# Patient Record
Sex: Male | Born: 1995 | Hispanic: Yes | Marital: Married | Smoking: Never smoker
Health system: Southern US, Community
[De-identification: ages and names within clinical notes are randomized; demographics above are authoritative.]

## PROBLEM LIST (undated history)

## (undated) DIAGNOSIS — Z111 Encounter for screening for respiratory tuberculosis: Secondary | ICD-10-CM

## (undated) DIAGNOSIS — J45909 Unspecified asthma, uncomplicated: Secondary | ICD-10-CM

## (undated) DIAGNOSIS — S62509A Fracture of unspecified phalanx of unspecified thumb, initial encounter for closed fracture: Secondary | ICD-10-CM

## (undated) DIAGNOSIS — S89319D Salter-Harris Type I physeal fracture of lower end of unspecified fibula, subsequent encounter for fracture with routine healing: Secondary | ICD-10-CM

## (undated) HISTORY — DX: Unspecified asthma, uncomplicated: J45.909

## (undated) HISTORY — DX: Salter-harris type i physeal fracture of lower end of unspecified fibula, subsequent encounter for fracture with routine healing: S89.319D

## (undated) HISTORY — DX: Encounter for screening for respiratory tuberculosis: Z11.1

## (undated) HISTORY — DX: Fracture of unspecified phalanx of unspecified thumb, initial encounter for closed fracture: S62.509A

---
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
## 2020-12-23 ENCOUNTER — Ambulatory Visit
Admission: EM | Admit: 2020-12-23 | Discharge: 2020-12-23 | Disposition: A | Discharge status: Home / Self Care | Payer: BC Managed Care – PPO | Attending: Urgent Care | Admitting: Urgent Care

## 2020-12-23 ENCOUNTER — Other Ambulatory Visit: Payer: Self-pay

## 2020-12-23 ENCOUNTER — Encounter: Payer: Self-pay | Admitting: Emergency Medicine

## 2020-12-23 DIAGNOSIS — M67431 Ganglion, right wrist: Secondary | ICD-10-CM | POA: Diagnosis not present

## 2020-12-23 MED ORDER — NAPROXEN 375 MG PO TABS: 375.0000 mg | ORAL_TABLET | Freq: Two times a day (BID) | ORAL | 0 refills | Status: DC

## 2020-12-23 NOTE — ED Triage Notes (Signed)
Pt here for right wrist pain and swollen area that has been present x several weeks

## 2020-12-23 NOTE — ED Provider Notes (Signed)
  [facility]   MRN: [id number] DOB: Oct 21, 1995  Subjective:   Christian Park is a 25 y.o. male presenting for several week history of persistent right breast mass that is becoming more more uncomfortable.  Patient states that there is a swollen spot that hurts with pressure or flexing at the wrist.  Denies fever, redness, warmth, drainage of pus or bleeding.   Current Facility-Administered Medications:    ibuprofen (ADVIL,MOTRIN) tablet 600 mg, 600 mg, Oral, Once, Kavanaugh, Lewayne Bunting, MD  Current Outpatient Medications:    albuterol (PROVENTIL HFA;VENTOLIN HFA) 108 (90 BASE) MCG/ACT inhaler, Inhale 2 puffs into the lungs every 4 (four) hours as needed for wheezing or shortness of breath., Disp: 1 Inhaler, Rfl: 2   Allergies  Allergen Reactions   Pomegranate [Punica] Rash    Past Medical History:  Diagnosis Date   Asthma    Salter-Harris Type I fracture of distal fibula with routine healing Feb 2010   left leg   Thumb fracture May 2010   right thumb   Tuberculosis screening Jan 2008   negative PPD     History reviewed. No pertinent surgical history.  History reviewed. No pertinent family history.  Social History   Tobacco Use   Smoking status: Never  Substance Use Topics   Alcohol use: No    ROS   Objective:   Vitals: BP 126/78 (BP Location: Left Arm)   Pulse 95   Temp 98.9 F (37.2 C) (Oral)   Resp 20   SpO2 97%   Physical Exam Constitutional:      General: He is not in acute distress.    Appearance: Normal appearance. He is well-developed and normal weight. He is not ill-appearing, toxic-appearing or diaphoretic.  HENT:     Head: Normocephalic and atraumatic.     Right Ear: External ear normal.     Left Ear: External ear normal.     Nose: Nose normal.     Mouth/Throat:     Pharynx: Oropharynx is clear.  Eyes:     General: No scleral icterus.       Right eye: No discharge.        Left eye: No discharge.     Extraocular Movements:  Extraocular movements intact.     Pupils: Pupils are equal, round, and reactive to light.  Cardiovascular:     Rate and Rhythm: Normal rate.  Pulmonary:     Effort: Pulmonary effort is normal.  Musculoskeletal:       Arms:     Cervical back: Normal range of motion.  Neurological:     Mental Status: He is alert and oriented to person, place, and time.  Psychiatric:        Mood and Affect: Mood normal.        Behavior: Behavior normal.        Thought Content: Thought content normal.        Judgment: Judgment normal.    ==  Assessment and Plan :   PDMP not reviewed this encounter.  1. Ganglion cyst of volar aspect of right wrist    High suspicion for ganglion cyst.  Recommended conservative management, naproxen for pain and inflammation, follow-up with hand specialist for procedural removal. Counseled patient on potential for adverse effects with medications prescribed/recommended today, ER and return-to-clinic precautions discussed, patient verbalized understanding.    Wallis Bamberg, PA-C 12/23/20 1225

## 2020-12-27 ENCOUNTER — Encounter: Payer: Self-pay | Admitting: Emergency Medicine

## 2020-12-27 ENCOUNTER — Other Ambulatory Visit: Payer: Self-pay

## 2020-12-27 ENCOUNTER — Ambulatory Visit
Admission: EM | Admit: 2020-12-27 | Discharge: 2020-12-27 | Disposition: A | Discharge status: Home / Self Care | Payer: BC Managed Care – PPO | Attending: Urgent Care | Admitting: Urgent Care

## 2020-12-27 DIAGNOSIS — J069 Acute upper respiratory infection, unspecified: Secondary | ICD-10-CM | POA: Diagnosis not present

## 2020-12-27 DIAGNOSIS — L299 Pruritus, unspecified: Secondary | ICD-10-CM

## 2020-12-27 DIAGNOSIS — Z20822 Contact with and (suspected) exposure to covid-19: Secondary | ICD-10-CM | POA: Diagnosis not present

## 2020-12-27 DIAGNOSIS — R21 Rash and other nonspecific skin eruption: Secondary | ICD-10-CM

## 2020-12-27 MED ORDER — CETIRIZINE HCL 10 MG PO TABS: 10.0000 mg | ORAL_TABLET | Freq: Every day | ORAL | 0 refills | Status: DC

## 2020-12-27 MED ORDER — BENZONATATE 100 MG PO CAPS: 100.0000 mg | ORAL_CAPSULE | Freq: Three times a day (TID) | ORAL | 0 refills | Status: DC | PRN

## 2020-12-27 MED ORDER — PROMETHAZINE-DM 6.25-15 MG/5ML PO SYRP: 5.0000 mL | ORAL_SOLUTION | Freq: Every evening | ORAL | 0 refills | Status: DC | PRN

## 2020-12-27 MED ORDER — HYDROXYZINE HCL 25 MG PO TABS: 12.5000 mg | ORAL_TABLET | Freq: Three times a day (TID) | ORAL | 0 refills | Status: DC | PRN

## 2020-12-27 MED ORDER — PSEUDOEPHEDRINE HCL 60 MG PO TABS: 60.0000 mg | ORAL_TABLET | Freq: Three times a day (TID) | ORAL | 0 refills | Status: DC | PRN

## 2020-12-27 NOTE — ED Triage Notes (Signed)
Patient c/o non-productive cough x 5 days, nasal congestion.  Patient has a rash that started on his back and now has spreaded all over.  Patient is not vaccinated for COVID.

## 2020-12-27 NOTE — ED Provider Notes (Signed)
[facility]   MRN: [id number] DOB: 07-May-1996  Subjective:   Christian Park is a 25 y.o. male presenting for 5-day history of acute onset sinus congestion, postnasal drainage, coughing.  Coughing can elicit head pain.  In the past couple of days he has developed a very itchy rash over his back.  Denies fever, sore throat, body aches, chest pain, shortness of breath, wheezing.  Has a history of asthma but has not needed to use an albuterol inhaler for a while. No COVID vaccination.    Current Facility-Administered Medications:    ibuprofen (ADVIL,MOTRIN) tablet 600 mg, 600 mg, Oral, Once, Kavanaugh, Lewayne Bunting, MD  Current Outpatient Medications:    albuterol (PROVENTIL HFA;VENTOLIN HFA) 108 (90 BASE) MCG/ACT inhaler, Inhale 2 puffs into the lungs every 4 (four) hours as needed for wheezing or shortness of breath., Disp: 1 Inhaler, Rfl: 2   naproxen (NAPROSYN) 375 MG tablet, Take 1 tablet (375 mg total) by mouth 2 (two) times daily with a meal., Disp: 30 tablet, Rfl: 0   Allergies  Allergen Reactions   Pomegranate [Punica] Rash    Past Medical History:  Diagnosis Date   Asthma    Salter-Harris Type I fracture of distal fibula with routine healing Feb 2010   left leg   Thumb fracture May 2010   right thumb   Tuberculosis screening Jan 2008   negative PPD     History reviewed. No pertinent surgical history.  No family history on file.  Social History   Tobacco Use   Smoking status: Never  Substance Use Topics   Alcohol use: No    ROS   Objective:   Vitals: BP 127/67 (BP Location: Left Arm)   Pulse 76   Temp 98.2 F (36.8 C) (Oral)   Ht 5\' 11"  (1.803 m)   Wt 215 lb (97.5 kg)   SpO2 98%   BMI 29.99 kg/m   Physical Exam Constitutional:      General: He is not in acute distress.    Appearance: Normal appearance. He is well-developed and normal weight. He is not ill-appearing, toxic-appearing or diaphoretic.  HENT:     Head: Normocephalic and  atraumatic.     Right Ear: External ear normal.     Left Ear: External ear normal.     Nose: Nose normal.     Mouth/Throat:     Mouth: Mucous membranes are moist.     Pharynx: No pharyngeal swelling, oropharyngeal exudate, posterior oropharyngeal erythema or uvula swelling.     Tonsils: No tonsillar exudate or tonsillar abscesses. 0 on the right. 0 on the left.  Eyes:     General: No scleral icterus.       Right eye: No discharge.        Left eye: No discharge.     Extraocular Movements: Extraocular movements intact.     Pupils: Pupils are equal, round, and reactive to light.  Cardiovascular:     Rate and Rhythm: Normal rate and regular rhythm.     Heart sounds: Normal heart sounds. No murmur heard.   No friction rub. No gallop.  Pulmonary:     Effort: Pulmonary effort is normal. No respiratory distress.     Breath sounds: Normal breath sounds. No stridor. No wheezing, rhonchi or rales.  Musculoskeletal:     Cervical back: Normal range of motion.  Skin:    General: Skin is warm and dry.     Findings: Rash (papular sand paper like rash over his  back) present.  Neurological:     Mental Status: He is alert and oriented to person, place, and time.  Psychiatric:        Mood and Affect: Mood normal.        Behavior: Behavior normal.        Thought Content: Thought content normal.        Judgment: Judgment normal.    Assessment and Plan :   PDMP not reviewed this encounter.  1. Viral URI with cough   2. Rash and nonspecific skin eruption   3. Itching     Will manage for viral illness such as viral URI, viral syndrome, viral rhinitis, COVID-19, viral exanthem.  Given physical exam findings, I have low suspicion for strep throat, monkeypox, pityriasis.  Counseled patient on nature of COVID-19 including modes of transmission, diagnostic testing, management and supportive care.  Offered scripts for symptomatic relief. COVID 19 testing is pending. Counseled patient on potential for  adverse effects with medications prescribed/recommended today, ER and return-to-clinic precautions discussed, patient verbalized understanding.     Wallis Bamberg, PA-C 12/27/20 1512

## 2020-12-27 NOTE — Discharge Instructions (Addendum)
We will notify you of your COVID-19 test results as they arrive and may take between 24 to 48 hours.  I encourage you to sign up for MyChart if you have not already done so as this can be the easiest way for [place] to communicate results to you online or through a phone app.  In the meantime, if you develop worsening symptoms including fever, chest pain, shortness of breath despite our current treatment plan then please report to the emergency room as this may be a sign of worsening status from possible COVID-19 infection.  Otherwise, we will manage this as a viral syndrome. For sore throat or cough try using a honey-based tea. Use 3 teaspoons of honey with juice squeezed from half lemon. Place shaved pieces of ginger into 1/2-1 cup of water and warm over stove top. Then mix the ingredients and repeat every 4 hours as needed. Please take Tylenol 500mg -650mg  every 6 hours for aches and pains, fevers. Hydrate very well with at least 2 liters of water. Eat light meals such as soups to replenish electrolytes and soft fruits, veggies. Start an antihistamine like Zyrtec, Allegra or Claritin for postnasal drainage, sinus congestion.  You can take this together with pseudoephedrine (Sudafed) at a dose of 60 mg 2-3 times a day as needed for the same kind of congestion.  Use hydroxyzine for the itchy rash. You will have cough syrup and cough capsules as well that you could use cough suppression.

## 2020-12-28 LAB — NOVEL CORONAVIRUS, NAA: SARS-CoV-2, NAA: NOT DETECTED

## 2020-12-28 LAB — SARS-COV-2, NAA 2 DAY TAT

## 2021-02-03 ENCOUNTER — Emergency Department (HOSPITAL_COMMUNITY)
Admission: EM | Admit: 2021-02-03 | Discharge: 2021-02-04 | Disposition: A | Discharge status: Home / Self Care | Payer: BC Managed Care – PPO | Attending: Emergency Medicine | Admitting: Emergency Medicine

## 2021-02-03 ENCOUNTER — Ambulatory Visit (INDEPENDENT_AMBULATORY_CARE_PROVIDER_SITE_OTHER): Payer: BC Managed Care – PPO

## 2021-02-03 ENCOUNTER — Other Ambulatory Visit: Payer: Self-pay

## 2021-02-03 ENCOUNTER — Encounter: Payer: Self-pay | Admitting: Urgent Care

## 2021-02-03 ENCOUNTER — Ambulatory Visit
Admission: EM | Admit: 2021-02-03 | Discharge: 2021-02-03 | Disposition: A | Discharge status: Home / Self Care | Payer: BC Managed Care – PPO | Attending: Urgent Care | Admitting: Urgent Care

## 2021-02-03 ENCOUNTER — Encounter (HOSPITAL_COMMUNITY): Payer: Self-pay | Admitting: Emergency Medicine

## 2021-02-03 ENCOUNTER — Emergency Department (HOSPITAL_COMMUNITY): Payer: BC Managed Care – PPO

## 2021-02-03 DIAGNOSIS — R059 Cough, unspecified: Secondary | ICD-10-CM | POA: Diagnosis not present

## 2021-02-03 DIAGNOSIS — J189 Pneumonia, unspecified organism: Secondary | ICD-10-CM | POA: Diagnosis not present

## 2021-02-03 DIAGNOSIS — R079 Chest pain, unspecified: Secondary | ICD-10-CM | POA: Diagnosis not present

## 2021-02-03 DIAGNOSIS — R202 Paresthesia of skin: Secondary | ICD-10-CM | POA: Insufficient documentation

## 2021-02-03 DIAGNOSIS — R0781 Pleurodynia: Secondary | ICD-10-CM | POA: Insufficient documentation

## 2021-02-03 DIAGNOSIS — R911 Solitary pulmonary nodule: Secondary | ICD-10-CM | POA: Diagnosis not present

## 2021-02-03 DIAGNOSIS — J45909 Unspecified asthma, uncomplicated: Secondary | ICD-10-CM | POA: Diagnosis not present

## 2021-02-03 DIAGNOSIS — J181 Lobar pneumonia, unspecified organism: Secondary | ICD-10-CM | POA: Diagnosis not present

## 2021-02-03 DIAGNOSIS — R918 Other nonspecific abnormal finding of lung field: Secondary | ICD-10-CM

## 2021-02-03 DIAGNOSIS — R0602 Shortness of breath: Secondary | ICD-10-CM | POA: Diagnosis not present

## 2021-02-03 LAB — BASIC METABOLIC PANEL
Anion gap: 12 (ref 5–15)
BUN: 11 mg/dL (ref 6–20)
CO2: 27 mmol/L (ref 22–32)
Calcium: 9 mg/dL (ref 8.9–10.3)
Chloride: 99 mmol/L (ref 98–111)
Creatinine, Ser: 1.18 mg/dL (ref 0.61–1.24)
GFR, Estimated: >60 mL/min (ref >60)
Glucose, Bld: 85 mg/dL (ref 70–99)
Potassium: 4.4 mmol/L (ref 3.5–5.1)
Sodium: 138 mmol/L (ref 135–145)

## 2021-02-03 LAB — CBC
HCT: 45.8 % (ref 39.0–52.0)
Hemoglobin: 15.3 g/dL (ref 13.0–17.0)
MCH: 29.9 pg (ref 26.0–34.0)
MCHC: 33.4 g/dL (ref 30.0–36.0)
MCV: 89.5 fL (ref 80.0–100.0)
Platelets: 321 10*3/uL (ref 150–400)
RBC: 5.12 MIL/uL (ref 4.22–5.81)
RDW: 12.4 % (ref 11.5–15.5)
WBC: 10.6 10*3/uL — ABNORMAL HIGH (ref 4.0–10.5)
nRBC: 0 % (ref 0.0–0.2)

## 2021-02-03 LAB — TROPONIN I (HIGH SENSITIVITY): Troponin I (High Sensitivity): <2 ng/L (ref <18)

## 2021-02-03 MED ORDER — AMOXICILLIN 500 MG PO CAPS: 1000.0000 mg | ORAL_CAPSULE | Freq: Three times a day (TID) | ORAL | 0 refills | Status: DC

## 2021-02-03 MED ORDER — AZITHROMYCIN 250 MG PO TABS: ORAL_TABLET | ORAL | 0 refills | Status: DC

## 2021-02-03 MED ORDER — BENZONATATE 100 MG PO CAPS: 100.0000 mg | ORAL_CAPSULE | Freq: Three times a day (TID) | ORAL | 0 refills | Status: DC | PRN

## 2021-02-03 MED ORDER — PROMETHAZINE-DM 6.25-15 MG/5ML PO SYRP: 5.0000 mL | ORAL_SOLUTION | Freq: Every evening | ORAL | 0 refills | Status: DC | PRN

## 2021-02-03 NOTE — ED Triage Notes (Signed)
Patient here with chest pain that started yesterday. Patient denies any nausea or vomiting with the chest pain.  No cough.  He is having pain with the deep breath.

## 2021-02-03 NOTE — ED Provider Notes (Signed)
[facility]   MRN: [id number] DOB: 05-28-1995  Subjective:   Christian Park is a 25 y.o. male presenting for 2 day history of acute onset mid-left sided chest pain with radiation to the left neck, tingling in the left arm. Symptoms actually started in his neck but then quickly migrated to the left side of his chest.  Was not doing anything in particular that was outside his normal routine.  Father has a history of MI, has a pacemaker. Believes this was a diagnosed established in his 47's. Has history of asthma, has not needed an inhaler since he was young. Denies fever, cough, sinus congestion, shob, n/v, abdominal pain, diaphoresis. No drug use, history of smoking, alcohol use.    Current Facility-Administered Medications:    ibuprofen (ADVIL,MOTRIN) tablet 600 mg, 600 mg, Oral, Once, Kavanaugh, Lewayne Bunting, MD  Current Outpatient Medications:    albuterol (PROVENTIL HFA;VENTOLIN HFA) 108 (90 BASE) MCG/ACT inhaler, Inhale 2 puffs into the lungs every 4 (four) hours as needed for wheezing or shortness of breath., Disp: 1 Inhaler, Rfl: 2   benzonatate (TESSALON) 100 MG capsule, Take 1-2 capsules (100-200 mg total) by mouth 3 (three) times daily as needed., Disp: 60 capsule, Rfl: 0   cetirizine (ZYRTEC ALLERGY) 10 MG tablet, Take 1 tablet (10 mg total) by mouth daily., Disp: 30 tablet, Rfl: 0   hydrOXYzine (ATARAX/VISTARIL) 25 MG tablet, Take 0.5-1 tablets (12.5-25 mg total) by mouth every 8 (eight) hours as needed for itching., Disp: 30 tablet, Rfl: 0   naproxen (NAPROSYN) 375 MG tablet, Take 1 tablet (375 mg total) by mouth 2 (two) times daily with a meal., Disp: 30 tablet, Rfl: 0   promethazine-dextromethorphan (PROMETHAZINE-DM) 6.25-15 MG/5ML syrup, Take 5 mLs by mouth at bedtime as needed for cough., Disp: 100 mL, Rfl: 0   pseudoephedrine (SUDAFED) 60 MG tablet, Take 1 tablet (60 mg total) by mouth every 8 (eight) hours as needed for congestion., Disp: 30 tablet, Rfl: 0    Allergies  Allergen Reactions   Pomegranate [Punica] Rash    Past Medical History:  Diagnosis Date   Asthma    Salter-Harris Type I fracture of distal fibula with routine healing Feb 2010   left leg   Thumb fracture May 2010   right thumb   Tuberculosis screening Jan 2008   negative PPD     No past surgical history on file.  No family history on file.  Social History   Tobacco Use   Smoking status: Never  Substance Use Topics   Alcohol use: No    ROS   Objective:   Vitals: BP 126/76 (BP Location: Left Arm)   Pulse 77   Temp 98.7 F (37.1 C) (Oral)   Resp 20   SpO2 97%   Physical Exam Constitutional:      General: He is not in acute distress.    Appearance: Normal appearance. He is well-developed. He is not ill-appearing, toxic-appearing or diaphoretic.  HENT:     Head: Normocephalic and atraumatic.     Right Ear: External ear normal.     Left Ear: External ear normal.     Nose: Nose normal.     Mouth/Throat:     Mouth: Mucous membranes are moist.     Pharynx: Oropharynx is clear.  Eyes:     General: No scleral icterus.    Extraocular Movements: Extraocular movements intact.     Pupils: Pupils are equal, round, and reactive to light.  Cardiovascular:  Rate and Rhythm: Normal rate and regular rhythm.     Heart sounds: Normal heart sounds. No murmur heard.   No friction rub. No gallop.  Pulmonary:     Effort: Pulmonary effort is normal. No respiratory distress.     Breath sounds: Normal breath sounds. No stridor. No wheezing, rhonchi or rales.  Neurological:     Mental Status: He is alert and oriented to person, place, and time.  Psychiatric:        Mood and Affect: Mood normal.        Behavior: Behavior normal.        Thought Content: Thought content normal.    ED ECG REPORT   Date: 02/03/2021  EKG Time: 10:00 AM  Rate: 83bpm  Rhythm: normal sinus rhythm,  normal EKG, normal sinus rhythm, nonspecific T wave inversion in lead III, T wave  flattening in aVF  Axis: Right  Intervals:none  ST&T Change: As above  Narrative Interpretation: Sinus rhythm at 83 bpm with nonspecific T wave changes as above and a rightward axis.  No previous EKG available for comparison.  DG Chest 2 View  Result Date: 02/03/2021 CLINICAL DATA:  25 year old male with chest pain for 2 days. No known injury. Cough. EXAM: CHEST - 2 VIEW COMPARISON:  None. FINDINGS: Normal lung volumes and mediastinal contours. Visualized tracheal air column is within normal limits. No consolidation or pleural effusion. On the AP view there is asymmetric density at the junction of the anterior left 5th and posterior 8th ribs, with no definite abnormal opacity on the lateral. Elsewhere lung markings appear normal. No osseous abnormality identified. Negative visible bowel gas pattern. IMPRESSION: Questionable asymmetric increased left lower lung opacity, only on the AP view. Consider mild or developing left bronchopneumonia in this setting. Follow-up radiographs may be valuable. Electronically Signed   By: Odessa Fleming M.D.   On: 02/03/2021 10:44    Assessment and Plan :   PDMP not reviewed this encounter.  1. Community acquired pneumonia of left lung, unspecified part of lung   2. Left-sided chest pain     Patient refused COVID-19 test.  Recommended amoxicillin and azithromycin for left sided community-acquired pneumonia.  Use supportive care. Counseled patient on potential for adverse effects with medications prescribed/recommended today, ER and return-to-clinic precautions discussed, patient verbalized understanding.    Wallis Bamberg, PA-C 02/03/21 1100

## 2021-02-04 ENCOUNTER — Telehealth: Payer: Self-pay | Admitting: Pulmonary Disease

## 2021-02-04 ENCOUNTER — Emergency Department (HOSPITAL_COMMUNITY): Payer: BC Managed Care – PPO

## 2021-02-04 DIAGNOSIS — J189 Pneumonia, unspecified organism: Secondary | ICD-10-CM | POA: Diagnosis not present

## 2021-02-04 DIAGNOSIS — R911 Solitary pulmonary nodule: Secondary | ICD-10-CM | POA: Diagnosis not present

## 2021-02-04 DIAGNOSIS — R079 Chest pain, unspecified: Secondary | ICD-10-CM | POA: Diagnosis not present

## 2021-02-04 DIAGNOSIS — R0602 Shortness of breath: Secondary | ICD-10-CM | POA: Diagnosis not present

## 2021-02-04 DIAGNOSIS — R0781 Pleurodynia: Secondary | ICD-10-CM | POA: Diagnosis not present

## 2021-02-04 LAB — TROPONIN I (HIGH SENSITIVITY): Troponin I (High Sensitivity): <2 ng/L (ref <18)

## 2021-02-04 MED ORDER — MORPHINE SULFATE (PF) 4 MG/ML IV SOLN
4.0000 mg | Freq: Once | INTRAVENOUS | Status: AC
Start: 2021-02-04 — End: 2021-02-04
  Administered 2021-02-04: 4 mg via INTRAVENOUS
  Filled 2021-02-04: qty 1

## 2021-02-04 MED ORDER — AMOXICILLIN-POT CLAVULANATE 875-125 MG PO TABS
1.0000 | ORAL_TABLET | Freq: Once | ORAL | Status: AC
  Administered 2021-02-04: 1 via ORAL
  Filled 2021-02-04: qty 1

## 2021-02-04 MED ORDER — IBUPROFEN 800 MG PO TABS
800.0000 mg | ORAL_TABLET | Freq: Once | ORAL | Status: AC
  Administered 2021-02-04: 800 mg via ORAL
  Filled 2021-02-04: qty 2

## 2021-02-04 MED ORDER — OXYCODONE-ACETAMINOPHEN 5-325 MG PO TABS: 1.0000 | ORAL_TABLET | Freq: Four times a day (QID) | ORAL | 0 refills | Status: DC | PRN

## 2021-02-04 MED ORDER — AMOXICILLIN-POT CLAVULANATE 875-125 MG PO TABS: 1.0000 | ORAL_TABLET | Freq: Two times a day (BID) | ORAL | 0 refills | Status: DC

## 2021-02-04 MED ORDER — OXYCODONE-ACETAMINOPHEN 5-325 MG PO TABS
1.0000 | ORAL_TABLET | Freq: Once | ORAL | Status: AC
  Administered 2021-02-04: 1 via ORAL
  Filled 2021-02-04: qty 1

## 2021-02-04 MED ORDER — IOHEXOL 350 MG/ML SOLN
40.0000 mL | Freq: Once | INTRAVENOUS | Status: AC | PRN
  Administered 2021-02-04: 40 mL via INTRAVENOUS

## 2021-02-04 NOTE — ED Notes (Signed)
PT has been called x3 no answer

## 2021-02-04 NOTE — ED Notes (Signed)
PT called X3 no answer.

## 2021-02-04 NOTE — Telephone Encounter (Signed)
Urgent care requesting office follow-up appointment -Please make appointment with next available provider for new consult in 1 week  -Admitted for chest pain, found to have multiple pulmonary nodules, nontoxic-appearing, no fever or leukocytosis -No risk factors for TB  Advised blood cultures, empiric treatment with antibiotics, differential includes metastatic malignancy, testicular exam

## 2021-02-04 NOTE — ED Provider Notes (Signed)
[facility] EMERGENCY DEPARTMENT Provider Note   CSN: [id number] Arrival date & time: 02/03/21  1952     History Chief Complaint  Patient presents with   Chest Pain    Delray Reza is a 25 y.o. male.  He is here with a complaint of left-sided chest pain that started 2 days ago.  He said the symptoms actually started in his neck and it was associated with some tingling on his left shoulder.  Pain is worse with cough and deep breath.  No history of same.  Does have family history of father with cardiac disease that started in his 71s.  No fevers chills vomiting abdominal pain.  Denies smoking or drug use.  No trauma.  Went to urgent care yesterday and diagnosed with left-sided pneumonia, given prescriptions for amoxicillin and Zithromax.  Has not started yet  The history is provided by the patient.  Chest Pain Pain location:  L chest Pain quality: sharp and stabbing   Pain severity:  Severe Onset quality:  Gradual Duration:  2 days Timing:  Constant Progression:  Worsening Chronicity:  New Relieved by:  None tried Worsened by:  Coughing and deep breathing Ineffective treatments:  None tried Associated symptoms: cough and shortness of breath   Associated symptoms: no abdominal pain, no dizziness, no fever, no headache, no nausea and no vomiting   Risk factors: male sex   Risk factors: no prior DVT/PE and no smoking       Past Medical History:  Diagnosis Date   Asthma    Salter-Harris Type I fracture of distal fibula with routine healing Feb 2010   left leg   Thumb fracture May 2010   right thumb   Tuberculosis screening Jan 2008   negative PPD    Patient Active Problem List   Diagnosis Date Noted   Family history of cardiovascular disease 04/05/2014   Body mass index, pediatric, 5th percentile to less than 85th percentile for age 51/17/2015   Wears glasses 02/08/2014    History reviewed. No pertinent surgical history.     Family History   Problem Relation Age of Onset   Heart disease Father     Social History   Tobacco Use   Smoking status: Never   Smokeless tobacco: Never  Substance Use Topics   Alcohol use: No   Drug use: Not Currently    Home Medications Prior to Admission medications   Medication Sig Start Date End Date Taking? Authorizing Provider  albuterol (PROVENTIL HFA;VENTOLIN HFA) 108 (90 BASE) MCG/ACT inhaler Inhale 2 puffs into the lungs every 4 (four) hours as needed for wheezing or shortness of breath. 05/01/14   Angelina Pih, MD  amoxicillin (AMOXIL) 500 MG capsule Take 2 capsules (1,000 mg total) by mouth 3 (three) times daily. 02/03/21   Wallis Bamberg, PA-C  azithromycin (ZITHROMAX) 250 MG tablet Start with 2 tablets today, then 1 daily thereafter. 02/03/21   Wallis Bamberg, PA-C  benzonatate (TESSALON) 100 MG capsule Take 1-2 capsules (100-200 mg total) by mouth 3 (three) times daily as needed. 02/03/21   Wallis Bamberg, PA-C  cetirizine (ZYRTEC ALLERGY) 10 MG tablet Take 1 tablet (10 mg total) by mouth daily. 12/27/20   Wallis Bamberg, PA-C  hydrOXYzine (ATARAX/VISTARIL) 25 MG tablet Take 0.5-1 tablets (12.5-25 mg total) by mouth every 8 (eight) hours as needed for itching. 12/27/20   Wallis Bamberg, PA-C  naproxen (NAPROSYN) 375 MG tablet Take 1 tablet (375 mg total) by mouth 2 (two) times  daily with a meal. 12/23/20   Wallis Bamberg, PA-C  promethazine-dextromethorphan (PROMETHAZINE-DM) 6.25-15 MG/5ML syrup Take 5 mLs by mouth at bedtime as needed for cough. 02/03/21   Wallis Bamberg, PA-C  pseudoephedrine (SUDAFED) 60 MG tablet Take 1 tablet (60 mg total) by mouth every 8 (eight) hours as needed for congestion. 12/27/20   Wallis Bamberg, PA-C    Allergies    Pomegranate [punica]  Review of Systems   Review of Systems  Constitutional:  Negative for fever.  HENT:  Negative for sore throat.   Eyes:  Negative for visual disturbance.  Respiratory:  Positive for cough and shortness of breath.   Cardiovascular:  Positive for  chest pain.  Gastrointestinal:  Negative for abdominal pain, nausea and vomiting.  Genitourinary:  Negative for dysuria.  Musculoskeletal:  Positive for neck pain.  Skin:  Negative for rash.  Neurological:  Negative for dizziness and headaches.   Physical Exam Updated Vital Signs BP 116/69 (BP Location: Left Arm)   Pulse 75   Temp 98.7 F (37.1 C)   Resp 17   SpO2 96%   Physical Exam Vitals and nursing note reviewed.  Constitutional:      Appearance: He is well-developed.  HENT:     Head: Normocephalic and atraumatic.  Eyes:     Conjunctiva/sclera: Conjunctivae normal.  Cardiovascular:     Rate and Rhythm: Normal rate and regular rhythm.     Heart sounds: Normal heart sounds. No murmur heard. Pulmonary:     Effort: Pulmonary effort is normal. No respiratory distress.     Breath sounds: Normal breath sounds.  Chest:     Chest wall: Tenderness present.     Comments: He has point tenderness of his left mid axillary line just below the breast.  Palpation of this reproduces patient's pain. Abdominal:     Palpations: Abdomen is soft.     Tenderness: There is no abdominal tenderness.  Genitourinary:    Comments: Testicular exam no masses appreciated Musculoskeletal:        General: Normal range of motion.     Cervical back: Neck supple.     Right lower leg: No tenderness.     Left lower leg: No tenderness.  Skin:    General: Skin is warm and dry.  Neurological:     General: No focal deficit present.     Mental Status: He is alert.     Cranial Nerves: No cranial nerve deficit.     Motor: No weakness.    ED Results / Procedures / Treatments   Labs (all labs ordered are listed, but only abnormal results are displayed) Labs Reviewed  CBC - Abnormal; Notable for the following components:      Result Value   WBC 10.6 (*)    All other components within normal limits  CULTURE, BLOOD (ROUTINE X 2)  CULTURE, BLOOD (ROUTINE X 2)  BASIC METABOLIC PANEL  TROPONIN I (HIGH  SENSITIVITY)  TROPONIN I (HIGH SENSITIVITY)    EKG EKG Interpretation  Date/Time:  Monday February 03 2021 21:00:22 EDT Ventricular Rate:  101 PR Interval:  126 QRS Duration: 86 QT Interval:  314 QTC Calculation: 407 R Axis:   88 Text Interpretation: Sinus tachycardia Abnormal QRS-T angle, consider primary T wave abnormality Abnormal ECG No significant change since last tracing Confirmed by Meridee Score [phone]) on 02/04/2021 8:18:09 AM  Radiology DG Chest 2 View  Result Date: 02/03/2021 CLINICAL DATA:  Chest pain. EXAM: CHEST - 2 VIEW COMPARISON:  Same  day. FINDINGS: The heart size and mediastinal contours are within normal limits. Right lung is clear. Continued presence of left lower lobe opacity is noted concerning for possible pneumonia or atelectasis. The visualized skeletal structures are unremarkable. IMPRESSION: Small left lower lobe airspace opacity is noted concerning for possible pneumonia or atelectasis. Electronically Signed   By: Lupita Raider M.D.   On: 02/03/2021 21:22   DG Chest 2 View  Result Date: 02/03/2021 CLINICAL DATA:  25 year old male with chest pain for 2 days. No known injury. Cough. EXAM: CHEST - 2 VIEW COMPARISON:  None. FINDINGS: Normal lung volumes and mediastinal contours. Visualized tracheal air column is within normal limits. No consolidation or pleural effusion. On the AP view there is asymmetric density at the junction of the anterior left 5th and posterior 8th ribs, with no definite abnormal opacity on the lateral. Elsewhere lung markings appear normal. No osseous abnormality identified. Negative visible bowel gas pattern. IMPRESSION: Questionable asymmetric increased left lower lung opacity, only on the AP view. Consider mild or developing left bronchopneumonia in this setting. Follow-up radiographs may be valuable. Electronically Signed   By: Odessa Fleming M.D.   On: 02/03/2021 10:44   CT Angio Chest PE W/Cm &/Or Wo Cm  Result Date: 02/04/2021 CLINICAL  DATA:  Shortness of breath, chest pain EXAM: CT ANGIOGRAPHY CHEST WITH CONTRAST TECHNIQUE: Multidetector CT imaging of the chest was performed using the standard protocol during bolus administration of intravenous contrast. Multiplanar CT image reconstructions and MIPs were obtained to evaluate the vascular anatomy. CONTRAST:  42mL OMNIPAQUE IOHEXOL 350 MG/ML SOLN COMPARISON:  None. FINDINGS: Cardiovascular: No filling defects in the pulmonary arteries to suggest pulmonary emboli. Heart is normal size. Aorta is normal caliber. Mediastinum/Nodes: No mediastinal, hilar, or axillary adenopathy. Soft tissue in anterior mediastinum thought to reflect residual thymus. Trachea and esophagus are unremarkable. Thyroid unremarkable. Lungs/Pleura: Consolidation in the lingula and left lower lobe concerning for pneumonia. Scattered pulmonary nodules, the largest in the left lower lobe with central cavitation measures 2.4 cm. Lingular nodule measures 9 mm. Right upper lobe nodule measures 7 mm. Several other similarly sized nodules throughout both lungs. No effusions. Upper Abdomen: Imaging into the upper abdomen demonstrates no acute findings. Musculoskeletal: Chest wall soft tissues are unremarkable. No acute bony abnormality. Review of the MIP images confirms the above findings. IMPRESSION: No evidence of pulmonary embolus. Numerous bilateral pulmonary nodules, the largest 2.4 cm with central cavitation in the left lower lobe. Differential considerations would include atypical infection or neoplasm. Septic emboli is a possibility. Consolidation in the left lower lobe and inferior lingula concerning for pneumonia. Electronically Signed   By: Charlett Nose M.D.   On: 02/04/2021 10:37    Procedures Procedures   Medications Ordered in ED Medications  ibuprofen (ADVIL) tablet 800 mg (800 mg Oral Given 02/04/21 0353)  iohexol (OMNIPAQUE) 350 MG/ML injection 40 mL (40 mLs Intravenous Contrast Given 02/04/21 1017)  morphine 4  MG/ML injection 4 mg (4 mg Intravenous Given 02/04/21 1133)  amoxicillin-clavulanate (AUGMENTIN) 875-125 MG per tablet 1 tablet (1 tablet Oral Given 02/04/21 1314)  oxyCODONE-acetaminophen (PERCOCET/ROXICET) 5-325 MG per tablet 1 tablet (1 tablet Oral Given 02/04/21 1314)    ED Course  I have reviewed the triage vital signs and the nursing notes.  Pertinent labs & imaging results that were available during my care of the patient were reviewed by me and considered in my medical decision making (see chart for details).  Clinical Course as of 02/04/21 1804  Tue Feb 04, 2021  1046 Patient CT abnormal with multiple pulmonary nodules some cavitary.  Patient denies any prior pulmonary history.  [place]  No history of TB exposure.  Not have a primary care doctor.  Was seen in urgent care yesterday and given antibiotics which she has not started yet. [MB]  1118 Discussed with Dr. Vassie Loll pulmonary.  He said that this could just be infectious although malignancy including testicular cancer would be in the differential.  Recommending testicular exam and close outpatient follow-up in pulmonary clinic.  Low back to find patient in significant pain.  Having to sit bolt upright.  Have ordered some pain medicine and blood cultures. [MB]  1201 Patient looks much more comfortable after some pain medicine and able to lie down flat.  Remains satting under percent on room air.  He is comfortable plan for outpatient management antibiotics pain medication and pulmonary follow-up.  Return instructions discussed [MB]    Clinical Course User Index [MB] Terrilee Files, MD   MDM Rules/Calculators/A&P                          This patient complains of left-sided pleuritic chest pain; this involves an extensive number of treatment Options and is a complaint that carries with it a high risk of complications and Morbidity. The differential includes pneumonia, pneumothorax, muscular, PE, pleurisy, pericarditis,  ACS  I ordered, reviewed and interpreted labs, which included CBC with mildly elevated white count, normal hemoglobin, chemistries normal, troponins flat I ordered medication oral and IV pain medication, oral antibiotics I ordered imaging studies which included chest x-ray and CT angio chest and I independently    visualized and interpreted imaging which showed no PE, does have lingular pneumonia and multiple pulmonary nodules 1 of which is cavitary Additional history obtained from patient's wife Previous records obtained and reviewed in epic including prior urgent care visit I consulted Dr. Vassie Loll the pulmonology and discussed lab and imaging findings  Critical Interventions: None  After the interventions stated above, I reevaluated the patient and found patient's pain to be improved.  Vital signs otherwise unremarkable.  He is agreeable to plan for outpatient management of his symptoms.  Have provided prescription for antibiotics and pain medication.  Pulmonary clinic to call him with outpatient follow-up.  Return instructions discussed   Final Clinical Impression(s) / ED Diagnoses Final diagnoses:  Community acquired pneumonia of left lung, unspecified part of lung  Pulmonary nodules  Pleuritic chest pain    Rx / DC Orders ED Discharge Orders          Ordered    oxyCODONE-acetaminophen (PERCOCET/ROXICET) 5-325 MG tablet  Every 6 hours PRN        02/04/21 1216    amoxicillin-clavulanate (AUGMENTIN) 875-125 MG tablet  Every 12 hours        02/04/21 1315             Terrilee Files, MD 02/04/21 1807

## 2021-02-04 NOTE — Telephone Encounter (Signed)
Consult scheduled with VS on 02/14/21 at 3:30.  Will forward back to RA to make him aware.

## 2021-02-04 NOTE — Discharge Instructions (Addendum)
Seen in the emergency department for worsening left-sided chest pain.  Had lab work that did not show any evidence of heart injury.  The CAT scan of your chest did show a pneumonia and multiple pulmonary nodules that will need follow-up with the pulmonary clinic.  They should be calling you for follow-up.  Please finish antibiotics.  Pain medicine as needed.  Return if any worsening or concerning symptoms

## 2021-02-09 LAB — CULTURE, BLOOD (ROUTINE X 2)
Culture: NO GROWTH
Culture: NO GROWTH
Special Requests: ADEQUATE

## 2021-02-11 DIAGNOSIS — M5382 Other specified dorsopathies, cervical region: Secondary | ICD-10-CM | POA: Diagnosis not present

## 2021-02-11 DIAGNOSIS — M9903 Segmental and somatic dysfunction of lumbar region: Secondary | ICD-10-CM | POA: Diagnosis not present

## 2021-02-11 DIAGNOSIS — M9901 Segmental and somatic dysfunction of cervical region: Secondary | ICD-10-CM | POA: Diagnosis not present

## 2021-02-11 DIAGNOSIS — M5386 Other specified dorsopathies, lumbar region: Secondary | ICD-10-CM | POA: Diagnosis not present

## 2021-02-14 ENCOUNTER — Ambulatory Visit (INDEPENDENT_AMBULATORY_CARE_PROVIDER_SITE_OTHER): Payer: BC Managed Care – PPO | Admitting: Pulmonary Disease

## 2021-02-14 ENCOUNTER — Encounter: Payer: Self-pay | Admitting: Pulmonary Disease

## 2021-02-14 ENCOUNTER — Other Ambulatory Visit: Payer: Self-pay

## 2021-02-14 ENCOUNTER — Ambulatory Visit (INDEPENDENT_AMBULATORY_CARE_PROVIDER_SITE_OTHER): Payer: BC Managed Care – PPO

## 2021-02-14 VITALS — BP 110/60 | HR 76 | Ht 70.0 in | Wt 215.0 lb

## 2021-02-14 DIAGNOSIS — J189 Pneumonia, unspecified organism: Secondary | ICD-10-CM | POA: Diagnosis not present

## 2021-02-14 DIAGNOSIS — J984 Other disorders of lung: Secondary | ICD-10-CM | POA: Diagnosis not present

## 2021-02-14 NOTE — Patient Instructions (Signed)
Lab test and chest xray today  Follow up in next week with Dr. Craige Cotta or Nurse Practitioner

## 2021-02-14 NOTE — Progress Notes (Signed)
[facility]  Chief Complaint  Patient presents with   Consult    Patient was in the hospital recently for pneumonia. He reports that he is still feeling bad.      Constitutional:  BP 110/60 (BP Location: Left Arm, Patient Position: Sitting, Cuff Size: Normal)   Pulse 76   Ht 5\' 10"  (1.778 m)   Wt 215 lb (97.5 kg)   SpO2 98%   BMI 30.85 kg/m   Past Medical History:  Childhood asthma  Past Surgical History:  He  has no past surgical history on file.  Brief Summary:  Christian Park is a 25 y.o. male with chest pain and dyspnea.      Subjective:   He developed a skin rash at the beginning of August 2022.  He thought this was poison ivy.  He used a skin cream and rash got better.  Shortly after this he developed sinus congestion and a cough.  He went to the ER.  He then developed fatigue, sweats, cough with green sputum, and pleuritic chest pain. He went to the ER again.  He was found to have lung nodules with cavitation on CT chest.  He was prescribed augmentin.  He doesn't feel any better.  He is still having pain.  He feels his joints are sore.  He had couple episodes of coughing up blood.  He isn't sure if he had a fever.  He never smoked cigarettes, doesn't drink alcohol, and denies illicit drug use.  He works at [employer].  He has two pet dogs.  No travel history or sick exposures.  Denies exposure to TB.  No prior history of pneumonia.  Physical Exam:   Appearance - sweaty  ENMT - no sinus tenderness, no oral exudate, no LAN, Mallampati 2 airway, no stridor  Respiratory - equal breath sounds bilaterally, no wheezing or rales  CV - s1s2 regular rate and rhythm, no murmurs  Ext - no clubbing, no edema  Skin - no rashes  Psych - normal mood and affect   Chest Imaging:  CT angio chest 02/04/21 >> consolidation in lingula and LLL, scattered nodules up to 2.4 cm with central cavitation in LLL  Sleep Tests:    Cardiac  Tests:    Social History:  He  reports that he has never smoked. He has never used smokeless tobacco. He reports that he does not currently use drugs. He reports that he does not drink alcohol.  Family History:  His family history includes Heart disease in his father.    Discussion:  He has subacute onset of dyspnea, sweats, pleuritic chest pain, productive cough with intermittent episodes of hemoptysis, and arthralgia.  He had skin rash in August 2022.  Uncertain significance of this, but it has since resolve.  He could either have atypical infection, inflammatory process, or malignant process.  Assessment/Plan:   Atypical pneumonia with cavitary lung lesions. - repeat chest xray today - will arrange for ANA, ANCA, RF, ESR, Quantiferon gold - depending on results, he might need bronchoscopy with airway sampling - advised he could use acetaminophen or ibuprofen as needed for pain control - letter written for him to remain out of work until assessment completed  Time Spent Involved in Patient Care on Day of Examination:  47 minutes  Follow up:   Patient Instructions  Lab test and chest xray today  Follow up in next week with Dr. [employer] or Nurse Practitioner  Medication List:  Allergies as of 02/14/2021       Reactions   Pomegranate [punica] Rash        Medication List        Accurate as of February 14, 2021  4:12 PM. If you have any questions, ask your nurse or doctor.          STOP taking these medications    Acetaminophen-Codeine 300-30 MG tablet Stopped by: Chesley Mires, MD   amoxicillin-clavulanate 875-125 MG tablet Commonly known as: AUGMENTIN Stopped by: Chesley Mires, MD   azithromycin 250 MG tablet Commonly known as: ZITHROMAX Stopped by: Chesley Mires, MD   benzonatate 100 MG capsule Commonly known as: TESSALON Stopped by: Chesley Mires, MD   cetirizine 10 MG tablet Commonly known as: ZyrTEC Allergy Stopped by: Chesley Mires, MD   hydrOXYzine 25  MG tablet Commonly known as: ATARAX/VISTARIL Stopped by: Chesley Mires, MD   naproxen 375 MG tablet Commonly known as: NAPROSYN Stopped by: Chesley Mires, MD   oxyCODONE-acetaminophen 5-325 MG tablet Commonly known as: PERCOCET/ROXICET Stopped by: Chesley Mires, MD   promethazine-dextromethorphan 6.25-15 MG/5ML syrup Commonly known as: PROMETHAZINE-DM Stopped by: Chesley Mires, MD   pseudoephedrine 60 MG tablet Commonly known as: SUDAFED Stopped by: Chesley Mires, MD       TAKE these medications    acetaminophen 500 MG tablet Commonly known as: TYLENOL Take 500-1,000 mg by mouth every 6 (six) hours as needed for moderate pain.   albuterol 108 (90 Base) MCG/ACT inhaler Commonly known as: VENTOLIN HFA Inhale 2 puffs into the lungs every 4 (four) hours as needed for wheezing or shortness of breath.   Fish Oil 1000 MG Caps Take 2 capsules by mouth daily.   flurbiprofen 100 MG tablet Commonly known as: ANSAID Take 100 mg by mouth 2 (two) times daily.   gabapentin 300 MG capsule Commonly known as: NEURONTIN Take 300 mg by mouth 2 (two) times daily as needed (pain).   GLUCOSAMINE 1500 COMPLEX PO Take 2 tablets by mouth daily.        Signature:  Chesley Mires, MD [place] Pager - [phone] 02/14/2021, 4:12 PM

## 2021-02-14 NOTE — Addendum Note (Signed)
Addended by: Demetrio Lapping E on: 02/14/2021 04:13 PM   Modules accepted: Orders

## 2021-02-14 NOTE — Addendum Note (Signed)
Addended by: Jinan Biggins E on: 02/14/2021 04:13 PM   Modules accepted: Orders  

## 2021-02-14 NOTE — Addendum Note (Signed)
Addended by: Ismael Treptow E on: 02/14/2021 04:13 PM   Modules accepted: Orders  

## 2021-02-15 LAB — ANA W/REFLEX IF POSITIVE: Anti Nuclear Antibody (ANA): NEGATIVE

## 2021-02-18 DIAGNOSIS — M9903 Segmental and somatic dysfunction of lumbar region: Secondary | ICD-10-CM | POA: Diagnosis not present

## 2021-02-18 DIAGNOSIS — M9901 Segmental and somatic dysfunction of cervical region: Secondary | ICD-10-CM | POA: Diagnosis not present

## 2021-02-18 DIAGNOSIS — M5386 Other specified dorsopathies, lumbar region: Secondary | ICD-10-CM | POA: Diagnosis not present

## 2021-02-18 DIAGNOSIS — M5382 Other specified dorsopathies, cervical region: Secondary | ICD-10-CM | POA: Diagnosis not present

## 2021-02-19 ENCOUNTER — Ambulatory Visit: Payer: BC Managed Care – PPO | Admitting: Primary Care

## 2021-02-19 DIAGNOSIS — M5386 Other specified dorsopathies, lumbar region: Secondary | ICD-10-CM | POA: Diagnosis not present

## 2021-02-19 DIAGNOSIS — M9903 Segmental and somatic dysfunction of lumbar region: Secondary | ICD-10-CM | POA: Diagnosis not present

## 2021-02-19 DIAGNOSIS — M5382 Other specified dorsopathies, cervical region: Secondary | ICD-10-CM | POA: Diagnosis not present

## 2021-02-19 DIAGNOSIS — M9901 Segmental and somatic dysfunction of cervical region: Secondary | ICD-10-CM | POA: Diagnosis not present

## 2021-02-20 ENCOUNTER — Other Ambulatory Visit: Payer: Self-pay

## 2021-02-20 ENCOUNTER — Ambulatory Visit: Payer: BC Managed Care – PPO | Admitting: Primary Care

## 2021-02-20 ENCOUNTER — Telehealth: Payer: Self-pay | Admitting: Primary Care

## 2021-02-20 DIAGNOSIS — M5382 Other specified dorsopathies, cervical region: Secondary | ICD-10-CM | POA: Diagnosis not present

## 2021-02-20 DIAGNOSIS — M9903 Segmental and somatic dysfunction of lumbar region: Secondary | ICD-10-CM | POA: Diagnosis not present

## 2021-02-20 DIAGNOSIS — J189 Pneumonia, unspecified organism: Secondary | ICD-10-CM | POA: Diagnosis not present

## 2021-02-20 DIAGNOSIS — M5386 Other specified dorsopathies, lumbar region: Secondary | ICD-10-CM | POA: Diagnosis not present

## 2021-02-20 DIAGNOSIS — M9901 Segmental and somatic dysfunction of cervical region: Secondary | ICD-10-CM | POA: Diagnosis not present

## 2021-02-20 LAB — QUANTIFERON-TB GOLD PLUS
Mitogen-NIL: 2.8 IU/mL
NIL: 0.11 IU/mL
QuantiFERON-TB Gold Plus: NEGATIVE
TB1-NIL: 0.06 IU/mL
TB2-NIL: 0.03 IU/mL

## 2021-02-20 LAB — SEDIMENTATION RATE: Sed Rate: 19 mm/h — ABNORMAL HIGH (ref 0–15)

## 2021-02-20 LAB — RHEUMATOID FACTOR: Rheumatoid fact SerPl-aCnc: <14 IU/mL (ref <14)

## 2021-02-20 LAB — ANCA SCREEN W REFLEX TITER: ANCA Screen: NEGATIVE

## 2021-02-20 NOTE — Assessment & Plan Note (Addendum)
-   Patient was diagnosed with atypical pneumonia with cavitary lung lesions in September 13th 2022, treated with 7 day course of oral Augmentin. Repeat chest imaging on 02/17/2021 showed mild improvement in left lower lobe lung airspace opacities since prior study. Christian Park has seen no significant improvement in his clinical symptoms after completing treatment. He continues to have hemoptysis and chest pain. Discussed with Dr. Craige Cotta who is recommending bronchoscopy for airway sampling. We reviewed procedure risk and benefits with patient and he is agreeing to proceed with diagnostic testing. He is not on a blood thinner or oxygen. He will be low risk for procedure. He has been set up for bronchoscopy with Dr. Delton Coombes on Wednesday October 5th at [facility]

## 2021-02-20 NOTE — Progress Notes (Signed)
_0  ID: Christian Park, male    DOB: April 25, 1996, 25 y.o.   MRN: [id number]  Chief Complaint  Patient presents with   Follow-up    F/U for Pneumonia. Still having cough with mucous and blood. Does not use his albuterol. Reports still having chest discomfort and heaviness.    Referring provider: No ref. provider found  HPI: 25 year old male, never smoked.  Past medical history significant for childhood asthma.  Patient of Dr. Halford Chessman, seen for initial consult on 02/14/2021.   Previous [facility] pulmonary encounter: 02/14/21 He developed a skin rash at the beginning of August 2022.  He thought this was poison ivy.  He used a skin cream and rash got better.  Shortly after this he developed sinus congestion and a cough.  He went to the ER.  He then developed fatigue, sweats, cough with green sputum, and pleuritic chest pain. He went to the ER again.  He was found to have lung nodules with cavitation on CT chest.  He was prescribed augmentin.  He doesn't feel any better.  He is still having pain.  He feels his joints are sore.  He had couple episodes of coughing up blood.  He isn't sure if he had a fever.   He never smoked cigarettes, doesn't drink alcohol, and denies illicit drug use.  He works at [employer].  He has two pet dogs.  No travel history or sick exposures.  Denies exposure to TB.  No prior history of pneumonia.  02/20/2021- Interim hx  Patient presents today for 1 week follow-up/atypical pneumonia with cavitary lung lesions.  Patient was diagnosed with atypical pneumonia in September, treated with Augmentin. Repeat chest imaging on 02/17/2021 showed mild improvement in lower lobe lung airspace opacities since prior study. He feels no better, he is still coughing up mucus with some blood and complains of chest discomfort. He is coughing up green phlegm throughout the day, slight blood tinge in the morning. He has bilateral chest pain when he takes a deep breath or cough. He has some joint pain in  this hands early morning. No shortness of breath. He is independent. Not on oxygen. Denies new fever or rashes.   Testing:  02/14/21>> RF, ANA, ANCA were negative  02/14/21>> Quantiferon-TB gold pending  02/04/21>> Blood cultures negative   Imaging: 02/04/21 [facility]>> No PE. Numerous bilateral pulmonary nodules, the largest 2.4 cm with central cavitation in the left lower lobe. Differential considerations would include atypical infection or neoplasm. Septic emboli is a possibility. Consolidation in the left lower lobe and inferior lingula concerning for pneumonia.    Allergies  Allergen Reactions   Pomegranate [Punica] Rash    Immunization History  Administered Date(s) Administered   DTaP 05/28/1997, 07/27/1997, 10/09/1997, 06/21/1998, 08/22/2005   HPV Quadrivalent 02/08/2014   Hepatitis A 02/01/2010   Hepatitis A, Ped/Adol-2 Dose 02/08/2014   Hepatitis B [birth year]-1997, 04/24/1997, 10/09/1997   HiB (PRP-OMP) 05/28/1997, 07/27/1997, 10/09/1997, 06/21/1998   IPV 05/28/1997, 07/27/1997, 10/09/1997, 04/19/2001   Influenza,inj,Quad PF,6+ Mos 02/08/2014   MMR 03/27/1998, 03/14/2001   Td 06/02/2008   Tdap 06/02/2008   Varicella 02/08/2014    Past Medical History:  Diagnosis Date   Asthma    Salter-Harris Type I fracture of distal fibula with routine healing Feb 2010   left leg   Thumb fracture May 2010   right thumb   Tuberculosis screening Jan 2008   negative PPD    Tobacco History: Social History   Tobacco Use  Smoking Status Never  Smokeless Tobacco Never   Counseling given: Not Answered   Outpatient Medications Prior to Visit  Medication Sig Dispense Refill   acetaminophen (TYLENOL) 500 MG tablet Take 500-1,000 mg by mouth every 6 (six) hours as needed for moderate pain.     albuterol (PROVENTIL HFA;VENTOLIN HFA) 108 (90 BASE) MCG/ACT inhaler Inhale 2 puffs into the lungs every 4 (four) hours as needed for wheezing or shortness of breath. 1 Inhaler 2   flurbiprofen  (ANSAID) 100 MG tablet Take 100 mg by mouth 2 (two) times daily.     gabapentin (NEURONTIN) 300 MG capsule Take 300 mg by mouth 2 (two) times daily as needed (pain).     Glucosamine-Chondroit-Vit C-Mn (GLUCOSAMINE 1500 COMPLEX PO) Take 2 tablets by mouth daily.     Omega-3 Fatty Acids (FISH OIL) 1000 MG CAPS Take 2 capsules by mouth daily.     Facility-Administered Medications Prior to Visit  Medication Dose Route Frequency Provider Last Rate Last Admin   ibuprofen (ADVIL,MOTRIN) tablet 600 mg  600 mg Oral Once Talitha Givens, MD          Review of Systems  Review of Systems  Constitutional: Negative.   HENT: Negative.    Respiratory:  Positive for cough. Negative for chest tightness, shortness of breath and wheezing.   Cardiovascular:  Positive for chest pain.    Physical Exam  BP 118/80 (BP Location: Left Arm, Patient Position: Sitting, Cuff Size: Normal)   Pulse 72   Ht _0  (1.803 m)   Wt 217 lb 6.4 oz (98.6 kg)   SpO2 100%   BMI 30.32 kg/m  Physical Exam Constitutional:      Appearance: Normal appearance.  HENT:     Head: Normocephalic and atraumatic.     Mouth/Throat:     Mouth: Mucous membranes are moist.     Pharynx: Oropharynx is clear.     Tonsils: 2+ on the right. 2+ on the left.  Cardiovascular:     Rate and Rhythm: Normal rate and regular rhythm.  Pulmonary:     Effort: Pulmonary effort is normal.     Breath sounds: Normal breath sounds. No wheezing, rhonchi or rales.  Musculoskeletal:        General: Normal range of motion.     Cervical back: Normal range of motion and neck supple.  Skin:    General: Skin is warm and dry.  Neurological:     General: No focal deficit present.     Mental Status: He is alert and oriented to person, place, and time. Mental status is at baseline.  Psychiatric:        Mood and Affect: Mood normal.        Behavior: Behavior normal.        Thought Content: Thought content normal.        Judgment: Judgment normal.      Lab Results:  CBC    Component Value Date/Time   WBC 10.6 (H) 02/03/2021 2102   RBC 5.12 02/03/2021 2102   HGB 15.3 02/03/2021 2102   HCT 45.8 02/03/2021 2102   PLT 321 02/03/2021 2102   MCV 89.5 02/03/2021 2102   MCH 29.9 02/03/2021 2102   MCHC 33.4 02/03/2021 2102   RDW 12.4 02/03/2021 2102    BMET    Component Value Date/Time   NA 138 02/03/2021 2102   K 4.4 02/03/2021 2102   CL 99 02/03/2021 2102   CO2 27 02/03/2021 2102   GLUCOSE 85 02/03/2021 2102  BUN 11 02/03/2021 2102   CREATININE 1.18 02/03/2021 2102   CALCIUM 9.0 02/03/2021 2102   GFRNONAA >60 02/03/2021 2102    BNP No results found for: BNP  ProBNP No results found for: PROBNP  Imaging: DG Chest 2 View  Result Date: 02/17/2021 CLINICAL DATA:  Follow-up pneumonia. EXAM: CHEST - 2 VIEW COMPARISON:  02/04/2011 FINDINGS: The heart size and mediastinal contours are within normal limits. Focal area of airspace opacity the left lower lung shows mild improvement since previous study. No new or worsening areas of pulmonary opacity are seen. No evidence of pleural effusion. IMPRESSION: Mild improvement in left lower lung airspace opacity since prior study. Electronically Signed   By: Marlaine Hind M.D.   On: 02/17/2021 07:38   DG Chest 2 View  Result Date: 02/03/2021 CLINICAL DATA:  Chest pain. EXAM: CHEST - 2 VIEW COMPARISON:  Same day. FINDINGS: The heart size and mediastinal contours are within normal limits. Right lung is clear. Continued presence of left lower lobe opacity is noted concerning for possible pneumonia or atelectasis. The visualized skeletal structures are unremarkable. IMPRESSION: Small left lower lobe airspace opacity is noted concerning for possible pneumonia or atelectasis. Electronically Signed   By: Marijo Conception M.D.   On: 02/03/2021 21:22   DG Chest 2 View  Result Date: 02/03/2021 CLINICAL DATA:  25 year old male with chest pain for 2 days. No known injury. Cough. EXAM: CHEST - 2  VIEW COMPARISON:  None. FINDINGS: Normal lung volumes and mediastinal contours. Visualized tracheal air column is within normal limits. No consolidation or pleural effusion. On the AP view there is asymmetric density at the junction of the anterior left 5th and posterior 8th ribs, with no definite abnormal opacity on the lateral. Elsewhere lung markings appear normal. No osseous abnormality identified. Negative visible bowel gas pattern. IMPRESSION: Questionable asymmetric increased left lower lung opacity, only on the AP view. Consider mild or developing left bronchopneumonia in this setting. Follow-up radiographs may be valuable. Electronically Signed   By: Genevie Ann M.D.   On: 02/03/2021 10:44   CT Angio Chest PE W/Cm &/Or Wo Cm  Result Date: 02/04/2021 CLINICAL DATA:  Shortness of breath, chest pain EXAM: CT ANGIOGRAPHY CHEST WITH CONTRAST TECHNIQUE: Multidetector CT imaging of the chest was performed using the standard protocol during bolus administration of intravenous contrast. Multiplanar CT image reconstructions and MIPs were obtained to evaluate the vascular anatomy. CONTRAST:  49m OMNIPAQUE IOHEXOL 350 MG/ML SOLN COMPARISON:  None. FINDINGS: Cardiovascular: No filling defects in the pulmonary arteries to suggest pulmonary emboli. Heart is normal size. Aorta is normal caliber. Mediastinum/Nodes: No mediastinal, hilar, or axillary adenopathy. Soft tissue in anterior mediastinum thought to reflect residual thymus. Trachea and esophagus are unremarkable. Thyroid unremarkable. Lungs/Pleura: Consolidation in the lingula and left lower lobe concerning for pneumonia. Scattered pulmonary nodules, the largest in the left lower lobe with central cavitation measures 2.4 cm. Lingular nodule measures 9 mm. Right upper lobe nodule measures 7 mm. Several other similarly sized nodules throughout both lungs. No effusions. Upper Abdomen: Imaging into the upper abdomen demonstrates no acute findings. Musculoskeletal:  Chest wall soft tissues are unremarkable. No acute bony abnormality. Review of the MIP images confirms the above findings. IMPRESSION: No evidence of pulmonary embolus. Numerous bilateral pulmonary nodules, the largest 2.4 cm with central cavitation in the left lower lobe. Differential considerations would include atypical infection or neoplasm. Septic emboli is a possibility. Consolidation in the left lower lobe and inferior lingula concerning for pneumonia.  Electronically Signed   By: Rolm Baptise M.D.   On: 02/04/2021 10:37     Assessment & Plan:   Atypical pneumonia - Patient was diagnosed with atypical pneumonia with cavitary lung lesions in September 13th 2022, treated with 7 day course of oral Augmentin. Repeat chest imaging on 02/17/2021 showed mild improvement in left lower lobe lung airspace opacities since prior study. Mr. Postlewait has seen no significant improvement in his clinical symptoms after completing treatment. He continues to have hemoptysis and chest pain. Discussed with Dr. Halford Chessman who is recommending bronchoscopy for airway sampling. We reviewed procedure risk and benefits with patient and he is agreeing to proceed with diagnostic testing. He is not on a blood thinner or oxygen. He will be low risk for procedure. He has been set up for bronchoscopy with Dr. Lamonte Sakai on Wednesday October 5th at 9am   40 mins spent on case and coordinating care;  > 50% face to face  Martyn Ehrich, NP 02/20/2021

## 2021-02-20 NOTE — Progress Notes (Signed)
Reviewed and agree with assessment/plan.   Mathan Darroch, MD [facility] 02/20/2021, 1:25 PM Pager:  [phone]  

## 2021-02-20 NOTE — Telephone Encounter (Signed)
Routing to [facility] pool so they are aware of the bronch too.

## 2021-02-20 NOTE — Patient Instructions (Addendum)
We will get you set up for bronchoscopy with one of Dr. Juanetta Gosling colleagues for airway sampling d/t atypical pneumonia   We will call you to set up date and time of procedure and go over pre-op instructions    Flexible Bronchoscopy Flexible bronchoscopy is a procedure used to examine the passageways in the lungs. During the procedure, a thin, flexible tool with a camera (bronchoscope) is passed into the mouth or nose, down through the windpipe (trachea), and into the air tubes in the lungs (bronchi). This tool allows the health care provider to look inside the lungs and to take samples for testing, if needed. Tell a health care provider about: Any allergies you have. All medicines you are taking, including vitamins, herbs, eye drops, creams, and over-the-counter medicines. Any problems you or family members have had with anesthetic medicines. Any blood disorders you have. Any surgeries you have had. Any medical conditions you have. Whether you are pregnant or may be pregnant. What are the risks? Generally, this is a safe procedure. However, problems may occur, including: Infection. Bleeding. Damage to other structures or organs. Allergic reactions to medicines. Collapsed lung (pneumothorax). Increased need for oxygen or difficulty breathing after the procedure. What happens before the procedure? Staying hydrated Follow instructions from your health care provider about hydration, which may include: Up to 2 hours before the procedure - you may continue to drink clear liquids, such as water, clear fruit juice, black coffee, and plain tea.  Eating and drinking restrictions Follow instructions from your health care provider about eating and drinking, which may include: 8 hours before the procedure - stop eating heavy meals or foods, such as meat, fried foods, or fatty foods. 6 hours before the procedure - stop eating light meals or foods, such as toast or cereal. 6 hours before the  procedure - stop drinking milk or drinks that contain milk. 2 hours before the procedure - stop drinking clear liquids. Medicines Ask your health care provider about: Changing or stopping your regular medicines. This is especially important if you are taking diabetes medicines or blood thinners. Taking medicines such as aspirin and ibuprofen. These medicines can thin your blood. Do not take these medicines unless your health care provider tells you to take them. Taking over-the-counter medicines, vitamins, herbs, and supplements. General instructions You may be given antibiotic medicine to help lower the risk of infection. Plan to have a responsible adult take you home from the hospital or clinic. If you will be going home right after the procedure, plan to have a responsible adult care for you for the time you are told. This is important. What happens during the procedure? An IV will be inserted into one of your veins. You will be given a medicine (local anesthetic) to numb your mouth, nose, throat, and voice box (larynx). You may also be given one or more of the following: A medicine to help you relax (sedative). A medicine to control coughing. A medicine to dry up any fluids or secretions in your lungs. A bronchoscope will be passed into your nose or mouth, and into your lungs. Your health care provider will examine your lungs. Samples of airway secretions may be collected for testing. If abnormal areas are seen in your airways, samples of tissue may be removed and checked under a microscope (biopsy). If tissue samples are needed from the outer parts of the lung, a type of X-ray (fluoroscopy) may be used to guide the bronchoscope to these areas. If bleeding occurs,  you may be given medicine to stop or decrease the bleeding. The procedure may vary among health care providers and hospitals. What can I expect after the procedure? Your blood pressure, heart rate, breathing rate, and blood  oxygen level will be monitored until you leave the hospital or clinic. You may have a chest X-ray to check for signs of pneumothorax. You willnot be allowed to eat or drink anything for 2 hours after your procedure. If a biopsy was taken, it is up to you to get the results of the test. Ask your health care provider, or the department that is doing the procedure, when your results will be ready. You may have the following symptoms for 24-48 hours: A cough that is worse than it was before the procedure. A low-grade fever. A sore throat or hoarse voice. Some blood in the mucus from your lungs (sputum), if a biopsy was done. Follow these instructions at home: Eating and drinking Do not eat or drink anything, including water, for 2 hours after your procedure, or until your numbing medicine has worn off. Having a numb throat increases your risk of burning yourself or choking. Start eating soft foods and slowly drinking liquids after your numbness is gone and your cough and gag reflexes have returned. You may return to your normal diet the day after the procedure. Driving If you were given a sedative during the procedure, it can affect you for several hours. Do not drive or operate machinery until your health care provider says that it is safe. Ask your health care provider if the medicine prescribed to you requires you to avoid driving or using machinery. Return to your normal activities as told by your health care provider. Ask your health care provider what activities are safe for you. General instructions  Take over-the-counter and prescription medicines only as told by your health care provider. Do not use any products that contain nicotine or tobacco. These products include cigarettes, chewing tobacco, and vaping devices, such as e-cigarettes. If you need help quitting, ask your health care provider. Keep all follow-up visits. This is important. Get help right away if: You have shortness of  breath that gets worse. You become light-headed or feel like you might faint. You have chest pain. You cough up more than a small amount of blood. These symptoms may represent a serious problem that is an emergency. Do not wait to see if the symptoms will go away. Get medical help right away. Call your local emergency services (911 in the [place] Do not drive yourself to the hospital. Summary Flexible bronchoscopy is a procedure that allows your health care provider to look closely inside your lungs and to take testing samples if needed. Risks of flexible bronchoscopy include bleeding, infection, and collapsed lung (pneumothorax). Before the procedure, you will be given a medicine to numb your mouth, nose, throat, and voice box. Then, a bronchoscope will be passed into your nose or mouth, and into your lungs. After the procedure, your blood pressure, heart rate, breathing rate, and blood oxygen level will be monitored until you leave the hospital or clinic. You may have a chest X-ray to check for signs of pneumothorax. You will not be allowed to eat or drink anything for 2 hours after your procedure. This information is not intended to replace advice given to you by your health care provider. Make sure you discuss any questions you have with your health care provider. Document Revised: 11/30/2019 Document Reviewed: 11/30/2019 Elsevier Patient Education  [place].

## 2021-02-20 NOTE — Telephone Encounter (Signed)
Dr. Delton Coombes can do bronchoscopy Wednesday 10/5 at 9am, he directly confirmed date/time with endo suite

## 2021-02-20 NOTE — Telephone Encounter (Signed)
Patient needs be be set up for flexible bronchoscopy for airway sampling with Dr. Lamonte Sakai or [facility] sometime next week. He needs AFB and fungal cultures. Also cytology.  Dr. Halford Chessman can fill out smart text for bronch orders

## 2021-02-24 NOTE — Telephone Encounter (Signed)
Bronch has already been scheduled.  I called [employer] & verified no PA is required per [facility] - call ref # [id number].

## 2021-02-25 ENCOUNTER — Other Ambulatory Visit: Payer: Self-pay

## 2021-02-25 ENCOUNTER — Encounter (HOSPITAL_COMMUNITY): Payer: Self-pay | Admitting: Emergency Medicine

## 2021-02-25 DIAGNOSIS — M5382 Other specified dorsopathies, cervical region: Secondary | ICD-10-CM | POA: Diagnosis not present

## 2021-02-25 DIAGNOSIS — M5386 Other specified dorsopathies, lumbar region: Secondary | ICD-10-CM | POA: Diagnosis not present

## 2021-02-25 DIAGNOSIS — M9903 Segmental and somatic dysfunction of lumbar region: Secondary | ICD-10-CM | POA: Diagnosis not present

## 2021-02-25 DIAGNOSIS — M9901 Segmental and somatic dysfunction of cervical region: Secondary | ICD-10-CM | POA: Diagnosis not present

## 2021-02-25 NOTE — Telephone Encounter (Signed)
Called and spoke with patient to make sure that he is aware of his procedure tomorrow. Asked him if they mentioned covid testing him there in the morning and he said yes, he was advised to be at the hospital at 6:30 in the morning and everything will be done. Nothing further needed at this time.

## 2021-02-25 NOTE — Progress Notes (Signed)
PCP - pt denies Cardiologist - pt denies EKG -  Chest x-ray -  ECHO -  Cardiac Cath -  CPAP -   COVID TEST- DOS  Anesthesia review: n/a  -------------  SDW INSTRUCTIONS:  Your procedure is scheduled on Wednesday 10/5. Please report to [facility] Main Entrance "A" at 0600 A.M., and check in at the Admitting office. Call this number if you have problems the morning of surgery: [phone]   Remember: Do not eat or drink after midnight the night before your surgery   Medications to take morning of surgery with a sip of water include: NONE   As of today, STOP taking any Aspirin (unless otherwise instructed by your surgeon), Aleve, Naproxen, Ibuprofen, Motrin, Advil, Goody's, BC's, all herbal medications, fish oil, and all vitamins.    The Morning of Surgery Do not wear jewelry Do not wear lotions, powders, colognes, or deodorant Do not bring valuables to the hospital. [facility] is not responsible for any belongings or valuables.  If you are a smoker, DO NOT Smoke 24 hours prior to surgery  If you wear a CPAP at night please bring your mask the morning of surgery   Remember that you must have someone to transport you home after your surgery, and remain with you for 24 hours if you are discharged the same day.  Please bring cases for contacts, glasses, hearing aids, dentures or bridgework because it cannot be worn into surgery.   Patients discharged the day of surgery will not be allowed to drive home.   Please shower the NIGHT BEFORE/MORNING OF SURGERY (use antibacterial soap like DIAL soap if possible). Wear comfortable clothes the morning of surgery. Oral Hygiene is also important to reduce your risk of infection.  Remember - BRUSH YOUR TEETH THE MORNING OF SURGERY WITH YOUR REGULAR TOOTHPASTE  Patient denies shortness of breath, fever, cough and chest pain.

## 2021-02-25 NOTE — Anesthesia Preprocedure Evaluation (Addendum)
Anesthesia Evaluation  Patient identified by MRN, date of birth, ID band Patient awake    Reviewed: Allergy & Precautions, NPO status , Patient's Chart, lab work & pertinent test results  History of Anesthesia Complications Negative for: history of anesthetic complications  Airway Mallampati: I  TM Distance: >3 FB Neck ROM: Full    Dental no notable dental hx.    Pulmonary asthma ,  Lung nodules   Pulmonary exam normal        Cardiovascular negative cardio ROS Normal cardiovascular exam     Neuro/Psych negative neurological ROS  negative psych ROS   GI/Hepatic negative GI ROS, Neg liver ROS,   Endo/Other  negative endocrine ROS  Renal/GU negative Renal ROS  negative genitourinary   Musculoskeletal negative musculoskeletal ROS (+)   Abdominal   Peds  Hematology negative hematology ROS (+)   Anesthesia Other Findings Day of surgery medications reviewed with patient.  Reproductive/Obstetrics negative OB ROS                            Anesthesia Physical Anesthesia Plan  ASA: 2  Anesthesia Plan: General   Post-op Pain Management:    Induction: Intravenous  PONV Risk Score and Plan: 2 and Treatment may vary due to age or medical condition, Ondansetron, Dexamethasone and Midazolam  Airway Management Planned: Oral ETT  Additional Equipment: None  Intra-op Plan:   Post-operative Plan: Extubation in OR  Informed Consent: I have reviewed the patients History and Physical, chart, labs and discussed the procedure including the risks, benefits and alternatives for the proposed anesthesia with the patient or authorized representative who has indicated his/her understanding and acceptance.     Dental advisory given  Plan Discussed with: CRNA  Anesthesia Plan Comments:        Anesthesia Quick Evaluation

## 2021-02-26 ENCOUNTER — Ambulatory Visit (HOSPITAL_COMMUNITY): Payer: BC Managed Care – PPO | Admitting: Anesthesiology

## 2021-02-26 ENCOUNTER — Ambulatory Visit (HOSPITAL_COMMUNITY)
Admission: RE | Admit: 2021-02-26 | Discharge: 2021-02-26 | Disposition: A | Discharge status: Home / Self Care | Payer: BC Managed Care – PPO | Source: Ambulatory Visit | Attending: Emergency Medicine | Admitting: Emergency Medicine

## 2021-02-26 ENCOUNTER — Ambulatory Visit (HOSPITAL_COMMUNITY): Payer: BC Managed Care – PPO

## 2021-02-26 ENCOUNTER — Encounter (HOSPITAL_COMMUNITY): Payer: Self-pay | Admitting: Emergency Medicine

## 2021-02-26 ENCOUNTER — Encounter (HOSPITAL_COMMUNITY)
Admission: RE | Disposition: A | Discharge status: Home / Self Care | Payer: Self-pay | Source: Ambulatory Visit | Attending: Emergency Medicine

## 2021-02-26 ENCOUNTER — Other Ambulatory Visit: Payer: Self-pay

## 2021-02-26 DIAGNOSIS — J189 Pneumonia, unspecified organism: Secondary | ICD-10-CM | POA: Diagnosis present

## 2021-02-26 DIAGNOSIS — Z20822 Contact with and (suspected) exposure to covid-19: Secondary | ICD-10-CM | POA: Insufficient documentation

## 2021-02-26 DIAGNOSIS — R918 Other nonspecific abnormal finding of lung field: Secondary | ICD-10-CM | POA: Insufficient documentation

## 2021-02-26 DIAGNOSIS — J45909 Unspecified asthma, uncomplicated: Secondary | ICD-10-CM | POA: Insufficient documentation

## 2021-02-26 DIAGNOSIS — Z419 Encounter for procedure for purposes other than remedying health state, unspecified: Secondary | ICD-10-CM

## 2021-02-26 DIAGNOSIS — Z9889 Other specified postprocedural states: Secondary | ICD-10-CM

## 2021-02-26 DIAGNOSIS — R911 Solitary pulmonary nodule: Secondary | ICD-10-CM | POA: Diagnosis not present

## 2021-02-26 HISTORY — PX: BRONCHIAL WASHINGS: SHX5105

## 2021-02-26 HISTORY — PX: VIDEO BRONCHOSCOPY WITH ENDOBRONCHIAL NAVIGATION: SHX6175

## 2021-02-26 HISTORY — PX: BRONCHIAL BRUSHINGS: SHX5108

## 2021-02-26 LAB — BODY FLUID CELL COUNT WITH DIFFERENTIAL
Eos, Fluid: 0 %
Eos, Fluid: 0 %
Lymphs, Fluid: 25 %
Lymphs, Fluid: 36 %
Monocyte-Macrophage-Serous Fluid: 69 % (ref 50–90)
Monocyte-Macrophage-Serous Fluid: 47 % — ABNORMAL LOW (ref 50–90)
Neutrophil Count, Fluid: 6 % (ref 0–25)
Neutrophil Count, Fluid: 17 % (ref 0–25)
Total Nucleated Cell Count, Fluid: 49 cu mm (ref 0–1000)
Total Nucleated Cell Count, Fluid: 29 cu mm (ref 0–1000)

## 2021-02-26 LAB — SARS CORONAVIRUS 2 BY RT PCR (HOSPITAL ORDER, PERFORMED IN ~~LOC~~ HOSPITAL LAB): SARS Coronavirus 2: NEGATIVE

## 2021-02-26 SURGERY — VIDEO BRONCHOSCOPY WITH ENDOBRONCHIAL NAVIGATION
Anesthesia: General

## 2021-02-26 MED ORDER — LIDOCAINE 2% (20 MG/ML) 5 ML SYRINGE
INTRAMUSCULAR | Status: DC | PRN
  Administered 2021-02-26: 100 mg via INTRAVENOUS

## 2021-02-26 MED ORDER — PROMETHAZINE HCL 25 MG/ML IJ SOLN: 6.2500 mg | INTRAMUSCULAR | Status: DC | PRN

## 2021-02-26 MED ORDER — SUCCINYLCHOLINE CHLORIDE 200 MG/10ML IV SOSY
PREFILLED_SYRINGE | INTRAVENOUS | Status: DC | PRN
  Administered 2021-02-26: 140 mg via INTRAVENOUS

## 2021-02-26 MED ORDER — MIDAZOLAM HCL 5 MG/5ML IJ SOLN
INTRAMUSCULAR | Status: DC | PRN
  Administered 2021-02-26: 2 mg via INTRAVENOUS

## 2021-02-26 MED ORDER — FENTANYL CITRATE (PF) 100 MCG/2ML IJ SOLN
INTRAMUSCULAR | Status: DC | PRN
  Administered 2021-02-26 (×2): 50 ug via INTRAVENOUS

## 2021-02-26 MED ORDER — ONDANSETRON HCL 4 MG/2ML IJ SOLN
INTRAMUSCULAR | Status: DC | PRN
  Administered 2021-02-26: 4 mg via INTRAVENOUS

## 2021-02-26 MED ORDER — DEXAMETHASONE SODIUM PHOSPHATE 4 MG/ML IJ SOLN
INTRAMUSCULAR | Status: DC | PRN
  Administered 2021-02-26: 10 mg via INTRAVENOUS

## 2021-02-26 MED ORDER — SUGAMMADEX SODIUM 200 MG/2ML IV SOLN
INTRAVENOUS | Status: DC | PRN
  Administered 2021-02-26: 200 mg via INTRAVENOUS

## 2021-02-26 MED ORDER — ROCURONIUM BROMIDE 100 MG/10ML IV SOLN
INTRAVENOUS | Status: DC | PRN
  Administered 2021-02-26: 20 mg via INTRAVENOUS
  Administered 2021-02-26 (×2): 30 mg via INTRAVENOUS

## 2021-02-26 MED ORDER — ESMOLOL HCL 100 MG/10ML IV SOLN
INTRAVENOUS | Status: DC | PRN
  Administered 2021-02-26 (×2): 20 mg via INTRAVENOUS

## 2021-02-26 MED ORDER — CHLORHEXIDINE GLUCONATE 0.12 % MT SOLN
15.0000 mL | Freq: Once | OROMUCOSAL | Status: AC
  Administered 2021-02-26: 15 mL via OROMUCOSAL
  Filled 2021-02-26: qty 15

## 2021-02-26 MED ORDER — FENTANYL CITRATE (PF) 100 MCG/2ML IJ SOLN: 25.0000 ug | INTRAMUSCULAR | Status: DC | PRN

## 2021-02-26 MED ORDER — LACTATED RINGERS IV SOLN: INTRAVENOUS | Status: DC

## 2021-02-26 MED ORDER — PROPOFOL 10 MG/ML IV BOLUS
INTRAVENOUS | Status: DC | PRN
  Administered 2021-02-26: 200 mg via INTRAVENOUS

## 2021-02-26 NOTE — Discharge Instructions (Signed)
Flexible Bronchoscopy, Care After This sheet gives you information about how to care for yourself after your test. Your doctor may also give you more specific instructions. If you have problems or questions, contact your doctor. Follow these instructions at home: Eating and drinking Do not eat or drink anything (not even water) for 2 hours after your test, or until your numbing medicine (local anesthetic) wears off. When your numbness is gone and your cough and gag reflexes have come back, you may: Eat only soft foods. Slowly drink liquids. The day after the test, go back to your normal diet. Driving Do not drive for 24 hours if you were given a medicine to help you relax (sedative). Do not drive or use heavy machinery while taking prescription pain medicine. General instructions  Take over-the-counter and prescription medicines only as told by your doctor. Return to your normal activities as told. Ask what activities are safe for you. Do not use any products that have nicotine or tobacco in them. This includes cigarettes and e-cigarettes. If you need help quitting, ask your doctor. Keep all follow-up visits as told by your doctor. This is important. It is very important if you had a tissue sample (biopsy) taken. Get help right away if: You have shortness of breath that gets worse. You get light-headed. You feel like you are going to pass out (faint). You have chest pain. You cough up: More than a little blood. More blood than before. Summary Do not eat or drink anything (not even water) for 2 hours after your test, or until your numbing medicine wears off. Do not use cigarettes. Do not use e-cigarettes. Get help right away if you have chest pain.  PLEASE CALL OUR OFFICE FOR ANY QUESTIONS OR CONCERNS. [phone].   This information is not intended to replace advice given to you by your health care provider. Make sure you discuss any questions you have with your health care  provider. Document Released: 03/08/2009 Document Revised: 04/23/2017 Document Reviewed: 05/29/2016 Elsevier Patient Education  2020 [employer].

## 2021-02-26 NOTE — Op Note (Signed)
Video Bronchoscopy with Robotic Assisted Bronchoscopic Navigation   Date of Operation: 02/26/2021   Pre-op Diagnosis: Bilateral pulmonary nodules, hemoptysis  Post-op Diagnosis: Same  Surgeon: Baltazar Apo  Assistants: None  Anesthesia: General endotracheal anesthesia  Operation: Flexible video fiberoptic bronchoscopy with robotic assistance and biopsies.  Estimated Blood Loss: Minimal  Complications: None  Indications and History: Christian Park is a 25 y.o. male with little past medical history except for asthma.  He was found to have bilateral pulmonary nodules, varied sizes, largest in the left lower lobe with possible cavity formation in the setting of an infectious illness.  He continues to feel ill and recommendation was made to obtain culture data, tissue diagnosis via navigational bronchoscopy. The risks, benefits, complications, treatment options and expected outcomes were discussed with the patient.  The possibilities of pneumothorax, pneumonia, reaction to medication, pulmonary aspiration, perforation of a viscus, bleeding, failure to diagnose a condition and creating a complication requiring transfusion or operation were discussed with the patient who freely signed the consent.    Description of Procedure: The patient was seen in the Preoperative Area, was examined and was deemed appropriate to proceed.  The patient was taken to [facility] endoscopy room 3, identified as Christian Park and the procedure verified as Flexible Video Fiberoptic Bronchoscopy.  A Time Out was held and the above information confirmed.   Prior to the date of the procedure a high-resolution CT scan of the chest was performed. Utilizing ION software program a virtual tracheobronchial tree was generated to allow the creation of distinct navigation pathways to the patient's parenchymal abnormalities. After being taken to the operating room general anesthesia was initiated and the patient  was orally intubated. The  video fiberoptic bronchoscope was introduced via the endotracheal tube and a general inspection was performed which showed normal right and left lung anatomy, aspiration of the bilateral mainstems was completed to remove any remaining secretions.  A bronchoalveolar lavage was performed in the left upper lobe with 60 cc of normal saline instilled and approximately 26 cc returned.  This will be sent for culture data, cytology, cell count.  Robotic catheter inserted into patient's endotracheal tube.   Target #1 left lower lobe pulmonary nodule: The distinct navigation pathways prepared prior to this procedure were then utilized to navigate to patient's lesion identified on CT scan. The robotic catheter was secured into place and the vision probe was withdrawn. Fluoroscopy and radial endobronchial ultrasound were performed but no discrete nodule could be identified.. Under fluoroscopic guidance transbronchial brushings were performed in the region of the nodule as determined by navigation to be sent for cytology. A bronchioalveolar lavage was performed in the left lower lobe adjacent to the nodule and sent for cytology and microbiology.  Target #2 right upper lobe pulmonary nodule: The distinct navigation pathways prepared prior to this procedure were then utilized to navigate to patient's lesion identified on CT scan. The robotic catheter was secured into place and the vision probe was withdrawn.  Fluoroscopy and radial endobronchial ultrasound for peripheral targeting were performed but no discrete nodule could be identified. Under fluoroscopic guidance transbronchial brushings were performed in the region of the nodules returned by navigation to be sent for cytology.   At the end of the procedure a general airway inspection was performed and there was no evidence of active bleeding. The bronchoscope was removed.  The patient tolerated the procedure well. There was no significant blood loss and there were no  obvious complications. A post-procedural chest x-ray is  pending.  Samples Target #1: 1. Transbronchial brushings from left lower lobe nodule 2. Bronchoalveolar lavage from left lower lobe sent for cytology, microbiology, cell count  Samples Target #2: 1. Transbronchial brushings from right upper lobe nodule  Other samples: 1.  Bronchoalveolar lavage from the left upper lobe sent for cytology, microbiology, cell count  Plans:  The patient will be discharged from the PACU to home when recovered from anesthesia and after chest x-ray is reviewed. We will review the cytology, pathology and microbiology results with the patient when they become available. Outpatient followup will be with Dr Halford Chessman.    Baltazar Apo, MD, PhD 02/26/2021, 10:23 AM  Pulmonary and Critical Care [phone] or if no answer before 7:00PM call [phone] For any issues after 7:00PM please call eLink [phone]

## 2021-02-26 NOTE — Anesthesia Postprocedure Evaluation (Signed)
Anesthesia Post Note  Patient: Delmore Sear  Procedure(s) Performed: VIDEO BRONCHOSCOPY WITH ENDOBRONCHIAL NAVIGATION BRONCHIAL WASHINGS BRONCHIAL BRUSHINGS     Patient location during evaluation: PACU Anesthesia Type: General Level of consciousness: awake and alert and oriented Pain management: pain level controlled Vital Signs Assessment: post-procedure vital signs reviewed and stable Respiratory status: spontaneous breathing, nonlabored ventilation and respiratory function stable Cardiovascular status: blood pressure returned to baseline Postop Assessment: no apparent nausea or vomiting Anesthetic complications: no   No notable events documented.  Last Vitals:  Vitals:   02/26/21 1205 02/26/21 1220  BP: (!) 116/58 (!) 116/58  Pulse: 63   Resp: (!) 7   Temp:  (!) 36.3 C  SpO2: 97% 98%    Last Pain:  Vitals:   02/26/21 1220  TempSrc:   PainSc: 0-No pain                 Shanda Howells

## 2021-02-26 NOTE — H&P (Signed)
Christian Park is an 25 y.o. male.   Chief Complaint: Abnormal CT chest, pulmonary nodules HPI: 25 year old man with a history of asthma.  He has been evaluated by Dr. Craige Cotta for an abnormal chest x-ray and CT scan of the chest.  Initially presented to the emergency department with sweats, fatigue, left-sided pleuritic chest pain, green sputum in mid September.  CT scan of the chest 02/04/2021 reviewed, showed no mediastinal or hilar adenopathy, consolidation in the lingula and left lower lobe with scattered pulmonary nodular findings largest 2.4 cm in the left lower lobe with some central cavitary change.  Also noted a 9 mm lingular nodule, 7 mm right upper lobe nodule.  He was treated with Augmentin for possible pneumonia. He has continued to experience left-sided chest discomfort, cough with green sputum, often blood-tinged.  Laboratory evaluation included negative RF, ANCA, ANA, QuantiFERON gold.  COVID-19 was negative. He presents today for further evaluation of his cough, hemoptysis, abnormal CT chest.  Past Medical History:  Diagnosis Date   Asthma    Salter-Harris Type I fracture of distal fibula with routine healing 06/25/2008   left leg   Thumb fracture 09/22/2008   right thumb   Tuberculosis screening 05/25/2006   negative PPD    History reviewed. No pertinent surgical history.  Family History  Problem Relation Age of Onset   Heart disease Father    Social History:  reports that he has never smoked. He has never used smokeless tobacco. He reports that he does not currently use drugs. He reports that he does not drink alcohol.  Allergies:  Allergies  Allergen Reactions   Pomegranate [Punica] Rash    Facility-Administered Medications Prior to Admission  Medication Dose Route Frequency Provider Last Rate Last Admin   ibuprofen (ADVIL,MOTRIN) tablet 600 mg  600 mg Oral Once Angelina Pih, MD       Medications Prior to Admission  Medication Sig Dispense Refill    Glucosamine-Chondroit-Vit C-Mn (GLUCOSAMINE 1500 COMPLEX PO) Take 2 tablets by mouth daily.     Omega-3 Fatty Acids (FISH OIL) 1000 MG CAPS Take 2,000 mg by mouth daily.      Results for orders placed or performed during the hospital encounter of 02/26/21 (from the past 48 hour(s))  SARS Coronavirus 2 by RT PCR (hospital order, performed in [facility] hospital lab) Nasopharyngeal Nasopharyngeal Swab     Status: None   Collection Time: 02/26/21  6:47 AM   Specimen: Nasopharyngeal Swab  Result Value Ref Range   SARS Coronavirus 2 NEGATIVE NEGATIVE    Comment: (NOTE) SARS-CoV-2 target nucleic acids are NOT DETECTED.  The SARS-CoV-2 RNA is generally detectable in upper and lower respiratory specimens during the acute phase of infection. The lowest concentration of SARS-CoV-2 viral copies this assay can detect is 250 copies / mL. A negative result does not preclude SARS-CoV-2 infection and should not be used as the sole basis for treatment or other patient management decisions.  A negative result may occur with improper specimen collection / handling, submission of specimen other than nasopharyngeal swab, presence of viral mutation(s) within the areas targeted by this assay, and inadequate number of viral copies (<250 copies / mL). A negative result must be combined with clinical observations, patient history, and epidemiological information.  Fact Sheet for Patients:   BoilerBrush.com.cy  Fact Sheet for Healthcare Providers: https://pope.com/  This test is not yet approved or  cleared by the [place] FDA and has been authorized for detection and/or diagnosis of SARS-CoV-2  by FDA under an Emergency Use Authorization (EUA).  This EUA will remain in effect (meaning this test can be used) for the duration of the COVID-19 declaration under Section 564(b)(1) of the Act, 21 U.S.C. section 360bbb-3(b)(1), unless the authorization is  terminated or revoked sooner.  Performed at [facility], [address] [place], [place] [address]    No results found.  Review of Systems Continues to have some left-sided chest discomfort.  Continues to have cough with green sputum, streaks of blood.  Happens most often in the morning.  Blood pressure 126/67, pulse (!) 54, temperature 97.9 F (36.6 C), temperature source Oral, resp. rate 18, height 5\' 11"  (1.803 m), weight 97.5 kg, SpO2 100 %. Physical Exam  Gen: Pleasant, well-nourished, in no distress,  normal affect  ENT: No lesions,  mouth clear,  oropharynx clear, no postnasal drip  Neck: No JVD, no stridor  Lungs: No use of accessory muscles, no crackles or wheezing on normal respiration, no wheeze on forced expiration  Cardiovascular: RRR, heart sounds normal, no murmur or gallops, no peripheral edema  Abdomen: soft and NT, no HSM,  BS normal  Musculoskeletal: No deformities, no cyanosis or clubbing  Neuro: alert, awake, non focal  Skin: Warm, no lesions or rashes   Assessment/Plan Bilateral pulmonary nodular disease with continued cough, sputum and hemoptysis consistent with possible infection.  Differential diagnosis here is broad.  ANCA negative but at risk for vasculitis, had a rash presentation.  Question possible embolic process, bacteremia/endocarditis or even malignancy.  -Recommend bronchoscopy with navigation to pulmonary nodule, tissue sampling and cultures.  Patient understands and agrees.  Risk and benefits discussed today. -Consider echocardiogram to evaluate for occult endocarditis.  Can discuss going forward depending on his bronchoscopy results  , MD 02/26/2021, 8:38 AM

## 2021-02-26 NOTE — Anesthesia Procedure Notes (Signed)
Procedure Name: Intubation Date/Time: 02/26/2021 9:06 AM Performed by: Caren Macadam, CRNA Pre-anesthesia Checklist: Patient identified, Emergency Drugs available, Suction available and Patient being monitored Patient Re-evaluated:Patient Re-evaluated prior to induction Oxygen Delivery Method: Circle system utilized Preoxygenation: Pre-oxygenation with 100% oxygen Induction Type: IV induction Ventilation: Mask ventilation without difficulty Laryngoscope Size: Miller and 2 Grade View: Grade I Tube type: Oral Tube size: 8.5 mm Number of attempts: 1 Airway Equipment and Method: Stylet Placement Confirmation: ETT inserted through vocal cords under direct vision, positive ETCO2 and breath sounds checked- equal and bilateral Secured at: 23 cm Tube secured with: Tape Dental Injury: Teeth and Oropharynx as per pre-operative assessment

## 2021-02-26 NOTE — Transfer of Care (Signed)
Immediate Anesthesia Transfer of Care Note  Patient: Christian Park  Procedure(s) Performed: VIDEO BRONCHOSCOPY WITH ENDOBRONCHIAL NAVIGATION BRONCHIAL WASHINGS BRONCHIAL BRUSHINGS  Patient Location: PACU  Anesthesia Type:General  Level of Consciousness: awake  Airway & Oxygen Therapy: Patient Spontanous Breathing and Patient connected to face mask oxygen  Post-op Assessment: Report given to RN and Post -op Vital signs reviewed and stable  Post vital signs: Reviewed and stable  Last Vitals:  Vitals Value Taken Time  BP 135/70 02/26/21 1036  Temp    Pulse 91 02/26/21 1036  Resp 21 02/26/21 1036  SpO2 100 % 02/26/21 1036  Vitals shown include unvalidated device data.  Last Pain:  Vitals:   02/26/21 0705  TempSrc:   PainSc: 2          Complications: No notable events documented.

## 2021-02-27 ENCOUNTER — Encounter (HOSPITAL_COMMUNITY): Payer: Self-pay | Admitting: Emergency Medicine

## 2021-02-27 DIAGNOSIS — M9901 Segmental and somatic dysfunction of cervical region: Secondary | ICD-10-CM | POA: Diagnosis not present

## 2021-02-27 DIAGNOSIS — M5386 Other specified dorsopathies, lumbar region: Secondary | ICD-10-CM | POA: Diagnosis not present

## 2021-02-27 DIAGNOSIS — M9903 Segmental and somatic dysfunction of lumbar region: Secondary | ICD-10-CM | POA: Diagnosis not present

## 2021-02-27 DIAGNOSIS — M5382 Other specified dorsopathies, cervical region: Secondary | ICD-10-CM | POA: Diagnosis not present

## 2021-02-27 LAB — CYTOLOGY - NON PAP

## 2021-02-27 LAB — ACID FAST SMEAR (AFB, MYCOBACTERIA)
Acid Fast Smear: NEGATIVE
Acid Fast Smear: NEGATIVE

## 2021-02-28 ENCOUNTER — Telehealth: Payer: Self-pay | Admitting: Emergency Medicine

## 2021-02-28 LAB — CULTURE, BAL-QUANTITATIVE W GRAM STAIN
Culture: NO GROWTH
Gram Stain: NONE SEEN

## 2021-02-28 NOTE — Telephone Encounter (Signed)
I reviewed cytology and cell counts with the patient by phone.  Everything is negative for malignancy.  Fungal and AFB smears are negative as well, all cultures are still pending.  He has an appointment to see Dr. Craige Cotta in several weeks to review the culture data as it matures.

## 2021-03-01 LAB — CULTURE, BAL-QUANTITATIVE W GRAM STAIN
Culture: <1000 — AB
Gram Stain: NONE SEEN

## 2021-03-03 DIAGNOSIS — M5386 Other specified dorsopathies, lumbar region: Secondary | ICD-10-CM | POA: Diagnosis not present

## 2021-03-03 DIAGNOSIS — M9903 Segmental and somatic dysfunction of lumbar region: Secondary | ICD-10-CM | POA: Diagnosis not present

## 2021-03-03 DIAGNOSIS — M5382 Other specified dorsopathies, cervical region: Secondary | ICD-10-CM | POA: Diagnosis not present

## 2021-03-03 DIAGNOSIS — M9901 Segmental and somatic dysfunction of cervical region: Secondary | ICD-10-CM | POA: Diagnosis not present

## 2021-03-03 LAB — ANAEROBIC CULTURE W GRAM STAIN

## 2021-03-04 LAB — ANAEROBIC CULTURE W GRAM STAIN: Gram Stain: NONE SEEN

## 2021-03-05 ENCOUNTER — Encounter: Payer: Self-pay | Admitting: Family Medicine

## 2021-03-05 ENCOUNTER — Other Ambulatory Visit: Payer: Self-pay

## 2021-03-05 ENCOUNTER — Ambulatory Visit (INDEPENDENT_AMBULATORY_CARE_PROVIDER_SITE_OTHER): Payer: BC Managed Care – PPO | Admitting: Family Medicine

## 2021-03-05 VITALS — BP 131/73 | HR 64 | Temp 98.8°F | Resp 16 | Ht 70.0 in | Wt 215.6 lb

## 2021-03-05 DIAGNOSIS — Z1159 Encounter for screening for other viral diseases: Secondary | ICD-10-CM

## 2021-03-05 DIAGNOSIS — Z7689 Persons encountering health services in other specified circumstances: Secondary | ICD-10-CM | POA: Diagnosis not present

## 2021-03-05 DIAGNOSIS — Z0000 Encounter for general adult medical examination without abnormal findings: Secondary | ICD-10-CM

## 2021-03-05 NOTE — Progress Notes (Signed)
Patient is her to est care. Patient has no new concerns today

## 2021-03-05 NOTE — Progress Notes (Signed)
New Patient Office Visit  Subjective:  Patient ID: Christian Park, male    DOB: [birth year]  Age: 25 y.o. MRN: [id number]  CC:  Chief Complaint  Patient presents with   Establish Care   Follow-up    HPI Christian Park presents for to establish care and for routine annual exam. Patient denies acute complaints or concerns.   Past Medical History:  Diagnosis Date   Asthma    Salter-Harris Type I fracture of distal fibula with routine healing 06/25/2008   left leg   Thumb fracture 09/22/2008   right thumb   Tuberculosis screening 05/25/2006   negative PPD    Past Surgical History:  Procedure Laterality Date   BRONCHIAL BRUSHINGS  02/26/2021   Procedure: BRONCHIAL BRUSHINGS;  Surgeon: Leslye Peer, MD;  Location: [facility] ENDOSCOPY;  Service: Pulmonary;;   BRONCHIAL WASHINGS  02/26/2021   Procedure: BRONCHIAL WASHINGS;  Surgeon: Leslye Peer, MD;  Location[facility] ENDOSCOPY;  Service: Pulmonary;;   VIDEO BRONCHOSCOPY WITH ENDOBRONCHIAL NAVIGATION N/A 02/26/2021   Procedure: VIDEO BRONCHOSCOPY WITH ENDOBRONCHIAL NAVIGATION;  Surgeon: Leslye Peer, MD;  Location: [facility] ENDOSCOPY;  Service: Pulmonary;  Laterality: N/A;    Family History  Problem Relation Age of Onset   Heart disease Father     Social History   Socioeconomic History   Marital status: Married    Spouse name: Not on file   Number of children: Not on file   Years of education: Not on file   Highest education level: Not on file  Occupational History   Not on file  Tobacco Use   Smoking status: Never   Smokeless tobacco: Never  Vaping Use   Vaping Use: Never used  Substance and Sexual Activity   Alcohol use: No   Drug use: Not Currently   Sexual activity: Yes  Other Topics Concern   Not on file  Social History Narrative   Not on file   Social Determinants of Health   Financial Resource Strain: Not on file  Food Insecurity: Not on file  Transportation Needs: Not on file  Physical Activity: Not on  file  Stress: Not on file  Social Connections: Not on file  Intimate Partner Violence: Not on file    ROS Review of Systems  All other systems reviewed and are negative.  Objective:   Today's Vitals: BP 131/73   Pulse 64   Temp 98.8 F (37.1 C) (Oral)   Resp 16   Ht 5\' 10"  (1.778 m)   Wt 215 lb 9.6 oz (97.8 kg)   SpO2 97%   BMI 30.94 kg/m   Physical Exam Vitals and nursing note reviewed.  Constitutional:      General: He is not in acute distress. HENT:     Head: Normocephalic and atraumatic.     Right Ear: Tympanic membrane, ear canal and external ear normal.     Left Ear: Tympanic membrane, ear canal and external ear normal.     Nose: Nose normal.     Mouth/Throat:     Mouth: Mucous membranes are moist.     Pharynx: Oropharynx is clear.  Eyes:     Conjunctiva/sclera: Conjunctivae normal.     Pupils: Pupils are equal, round, and reactive to light.  Neck:     Thyroid: No thyromegaly.  Cardiovascular:     Rate and Rhythm: Normal rate and regular rhythm.     Heart sounds: Normal heart sounds. No murmur heard. Pulmonary:     Effort: Pulmonary  effort is normal.     Breath sounds: Normal breath sounds.  Abdominal:     General: There is no distension.     Palpations: Abdomen is soft. There is no mass.     Tenderness: There is no abdominal tenderness.     Hernia: There is no hernia in the left inguinal area or right inguinal area.  Genitourinary:    Comments: Patient deferred Musculoskeletal:        General: Normal range of motion.     Cervical back: Normal range of motion and neck supple.     Right lower leg: No edema.     Left lower leg: No edema.  Skin:    General: Skin is warm and dry.  Neurological:     General: No focal deficit present.     Mental Status: He is alert and oriented to person, place, and time. Mental status is at baseline.  Psychiatric:        Mood and Affect: Mood normal.        Behavior: Behavior normal.    Assessment & Plan:    1.  Visit for well man health check Unremarkable exam. Routine labs ordered - CMP14+EGFR - CBC with Differential  2. Need for hepatitis C screening test  - Hepatitis C Antibody  3. Encounter to establish care    Outpatient Encounter Medications as of 03/05/2021  Medication Sig   Glucosamine-Chondroit-Vit C-Mn (GLUCOSAMINE 1500 COMPLEX PO) Take 2 tablets by mouth daily. (Patient not taking: Reported on 03/05/2021)   Omega-3 Fatty Acids (FISH OIL) 1000 MG CAPS Take 2,000 mg by mouth daily. (Patient not taking: Reported on 03/05/2021)   No facility-administered encounter medications on file as of 03/05/2021.    Follow-up: No follow-ups on file.   Becky Sax, MD

## 2021-03-06 LAB — CMP14+EGFR
ALT: 47 IU/L — ABNORMAL HIGH (ref 0–44)
AST: 25 IU/L (ref 0–40)
Albumin/Globulin Ratio: 1.7 (ref 1.2–2.2)
Albumin: 5 g/dL (ref 4.1–5.2)
Alkaline Phosphatase: 115 IU/L (ref 44–121)
BUN/Creatinine Ratio: 11 (ref 9–20)
BUN: 11 mg/dL (ref 6–20)
Bilirubin Total: 0.7 mg/dL (ref 0.0–1.2)
CO2: 26 mmol/L (ref 20–29)
Calcium: 10.3 mg/dL — ABNORMAL HIGH (ref 8.7–10.2)
Chloride: 93 mmol/L — ABNORMAL LOW (ref 96–106)
Creatinine, Ser: 1 mg/dL (ref 0.76–1.27)
Globulin, Total: 2.9 g/dL (ref 1.5–4.5)
Glucose: 79 mg/dL (ref 70–99)
Potassium: 4.8 mmol/L (ref 3.5–5.2)
Sodium: 135 mmol/L (ref 134–144)
Total Protein: 7.9 g/dL (ref 6.0–8.5)
eGFR: 108 mL/min/{1.73_m2} (ref >59)

## 2021-03-06 LAB — CBC WITH DIFFERENTIAL/PLATELET
Basophils Absolute: 0 10*3/uL (ref 0.0–0.2)
Basos: 0 %
EOS (ABSOLUTE): 0.2 10*3/uL (ref 0.0–0.4)
Eos: 3 %
Hematocrit: 46.6 % (ref 37.5–51.0)
Hemoglobin: 16.7 g/dL (ref 13.0–17.7)
Immature Grans (Abs): 0 10*3/uL (ref 0.0–0.1)
Immature Granulocytes: 0 %
Lymphocytes Absolute: 1.6 10*3/uL (ref 0.7–3.1)
Lymphs: 30 %
MCH: 30.3 pg (ref 26.6–33.0)
MCHC: 35.8 g/dL — ABNORMAL HIGH (ref 31.5–35.7)
MCV: 85 fL (ref 79–97)
Monocytes Absolute: 0.4 10*3/uL (ref 0.1–0.9)
Monocytes: 6 %
Neutrophils Absolute: 3.3 10*3/uL (ref 1.4–7.0)
Neutrophils: 61 %
Platelets: 219 10*3/uL (ref 150–450)
RBC: 5.51 x10E6/uL (ref 4.14–5.80)
RDW: 13.2 % (ref 11.6–15.4)
WBC: 5.5 10*3/uL (ref 3.4–10.8)

## 2021-03-06 LAB — HEPATITIS C ANTIBODY: Hep C Virus Ab: <0.1 s/co ratio (ref 0.0–0.9)

## 2021-03-11 DIAGNOSIS — M9903 Segmental and somatic dysfunction of lumbar region: Secondary | ICD-10-CM | POA: Diagnosis not present

## 2021-03-11 DIAGNOSIS — M5386 Other specified dorsopathies, lumbar region: Secondary | ICD-10-CM | POA: Diagnosis not present

## 2021-03-11 DIAGNOSIS — M5382 Other specified dorsopathies, cervical region: Secondary | ICD-10-CM | POA: Diagnosis not present

## 2021-03-11 DIAGNOSIS — M9901 Segmental and somatic dysfunction of cervical region: Secondary | ICD-10-CM | POA: Diagnosis not present

## 2021-03-14 ENCOUNTER — Ambulatory Visit: Payer: BC Managed Care – PPO | Admitting: Pulmonary Disease

## 2021-03-14 ENCOUNTER — Other Ambulatory Visit: Payer: Self-pay

## 2021-03-14 ENCOUNTER — Ambulatory Visit (INDEPENDENT_AMBULATORY_CARE_PROVIDER_SITE_OTHER): Payer: BC Managed Care – PPO

## 2021-03-14 ENCOUNTER — Encounter: Payer: Self-pay | Admitting: Pulmonary Disease

## 2021-03-14 VITALS — BP 120/77 | HR 75 | Temp 97.7°F | Ht 70.0 in | Wt 230.0 lb

## 2021-03-14 DIAGNOSIS — Z8701 Personal history of pneumonia (recurrent): Secondary | ICD-10-CM

## 2021-03-14 DIAGNOSIS — R918 Other nonspecific abnormal finding of lung field: Secondary | ICD-10-CM | POA: Diagnosis not present

## 2021-03-14 NOTE — Patient Instructions (Signed)
Chest xray today and will call to schedule follow up if needed after review of xray

## 2021-03-14 NOTE — Progress Notes (Signed)
[facility]  Chief Complaint  Patient presents with   Follow-up    Bronchitis  results    Constitutional:  BP 120/77 (BP Location: Right Arm, Cuff Size: Normal)   Pulse 75   Temp 97.7 F (36.5 C) (Oral)   Ht _0  (1.778 m)   Wt 230 lb (104.3 kg)   SpO2 98%   BMI 33.00 kg/m   Past Medical History:  Childhood asthma  Past Surgical History:  He  has a past surgical history that includes Video bronchoscopy with endobronchial navigation (N/A, 02/26/2021); Bronchial washings (02/26/2021); and Bronchial brushings (02/26/2021).  Brief Summary:  Christian Park is a 25 y.o. male with chest pain and dyspnea.      Subjective:   He had bronchoscopy on 02/26/21.  Results negative for malignancy.  Culture results negative to date.  He is feeling better.  Not having much cough or sputum.  Denies fever, sweats, or chest pain.  No skin rash or joint swelling.  He was told by his insurance that there was another way to get lung sampling besides bronchoscopy, but wasn't told what this alternative method was.  As such he was told by his insurance that they denied coverage for the procedure and he would have to pay $8000.    Physical Exam:   Appearance - well kempt   ENMT - no sinus tenderness, no oral exudate, no LAN, Mallampati 2 airway, no stridor  Respiratory - equal breath sounds bilaterally, no wheezing or rales  CV - s1s2 regular rate and rhythm, no murmurs  Ext - no clubbing, no edema  Skin - no rashes  Psych - normal mood and affect    Chest Imaging:  CT angio chest 02/04/21 >> consolidation in lingula and LLL, scattered nodules up to 2.4 cm with central cavitation in LLL Quantiferon gold 02/14/21 >> negative Serology 02/14/21 >> ANCA, ANA negative Bronchoscopy 02/26/21 >> cytology negative  Social History:  He  reports that he has never smoked. He has never used smokeless tobacco. He reports that he does not currently use  drugs. He reports that he does not drink alcohol.  Family History:  His family history includes Heart disease in his father.     Assessment/Plan:   Atypical pneumonia with cavitary lung lesions. - serology, cytology negative - cultures negative to date; AFB and fungal culture from BAL still pending - clinically improved - repeat chest xray today - explained I am not aware of what alternative testing method his insurance was referring to; he will await appeals process and let us know if additional assistance is needed for his insurance claim  Time Spent Involved in Patient Care on Day of Examination:  23 minutes  Follow up:   Patient Instructions  Chest xray today and will call to schedule follow up if needed after review of xray  Medication List:   Allergies as of 03/14/2021       Reactions   Pomegranate [punica] Rash        Medication List        Accurate as of March 14, 2021  3:23 PM. If you have any questions, ask your nurse or doctor.          Fish Oil 1000 MG Caps Take 2,000 mg by mouth daily.   GLUCOSAMINE 1500 COMPLEX PO Take 2 tablets by mouth daily.        Signature:  Chesley Mires, MD [place] Pager - [phone])  [phone] 03/14/2021, 3:23 PM

## 2021-03-19 ENCOUNTER — Other Ambulatory Visit: Payer: Self-pay

## 2021-03-19 DIAGNOSIS — J189 Pneumonia, unspecified organism: Secondary | ICD-10-CM

## 2021-03-20 DIAGNOSIS — M9901 Segmental and somatic dysfunction of cervical region: Secondary | ICD-10-CM | POA: Diagnosis not present

## 2021-03-20 DIAGNOSIS — M5386 Other specified dorsopathies, lumbar region: Secondary | ICD-10-CM | POA: Diagnosis not present

## 2021-03-20 DIAGNOSIS — M5382 Other specified dorsopathies, cervical region: Secondary | ICD-10-CM | POA: Diagnosis not present

## 2021-03-20 DIAGNOSIS — M9903 Segmental and somatic dysfunction of lumbar region: Secondary | ICD-10-CM | POA: Diagnosis not present

## 2021-03-25 DIAGNOSIS — M5386 Other specified dorsopathies, lumbar region: Secondary | ICD-10-CM | POA: Diagnosis not present

## 2021-03-25 DIAGNOSIS — M9903 Segmental and somatic dysfunction of lumbar region: Secondary | ICD-10-CM | POA: Diagnosis not present

## 2021-03-25 DIAGNOSIS — M9901 Segmental and somatic dysfunction of cervical region: Secondary | ICD-10-CM | POA: Diagnosis not present

## 2021-03-25 DIAGNOSIS — M5382 Other specified dorsopathies, cervical region: Secondary | ICD-10-CM | POA: Diagnosis not present

## 2021-03-26 ENCOUNTER — Ambulatory Visit: Payer: BC Managed Care – PPO | Admitting: Pulmonary Disease

## 2021-03-27 DIAGNOSIS — M5386 Other specified dorsopathies, lumbar region: Secondary | ICD-10-CM | POA: Diagnosis not present

## 2021-03-27 DIAGNOSIS — M9903 Segmental and somatic dysfunction of lumbar region: Secondary | ICD-10-CM | POA: Diagnosis not present

## 2021-03-27 DIAGNOSIS — M5382 Other specified dorsopathies, cervical region: Secondary | ICD-10-CM | POA: Diagnosis not present

## 2021-03-27 DIAGNOSIS — M9901 Segmental and somatic dysfunction of cervical region: Secondary | ICD-10-CM | POA: Diagnosis not present

## 2021-03-27 LAB — FUNGUS CULTURE RESULT

## 2021-03-27 LAB — FUNGAL ORGANISM REFLEX

## 2021-03-27 LAB — FUNGUS CULTURE WITH STAIN

## 2021-04-01 ENCOUNTER — Encounter: Payer: Self-pay | Admitting: Pulmonary Disease

## 2021-04-01 DIAGNOSIS — J984 Other disorders of lung: Secondary | ICD-10-CM | POA: Insufficient documentation

## 2021-04-12 LAB — ACID FAST CULTURE WITH REFLEXED SENSITIVITIES (MYCOBACTERIA): Acid Fast Culture: NEGATIVE

## 2021-04-15 LAB — ACID FAST CULTURE WITH REFLEXED SENSITIVITIES (MYCOBACTERIA): Acid Fast Culture: NEGATIVE

## 2022-10-05 IMAGING — DX DG CHEST 2V
2 series · 2 of 2 positions shown · non-contrast
Comparison: None.

CLINICAL DATA: 24-year-old male with chest pain for 2 days. No
known injury. Cough.

EXAM:
CHEST - 2 VIEW

[chest pa]
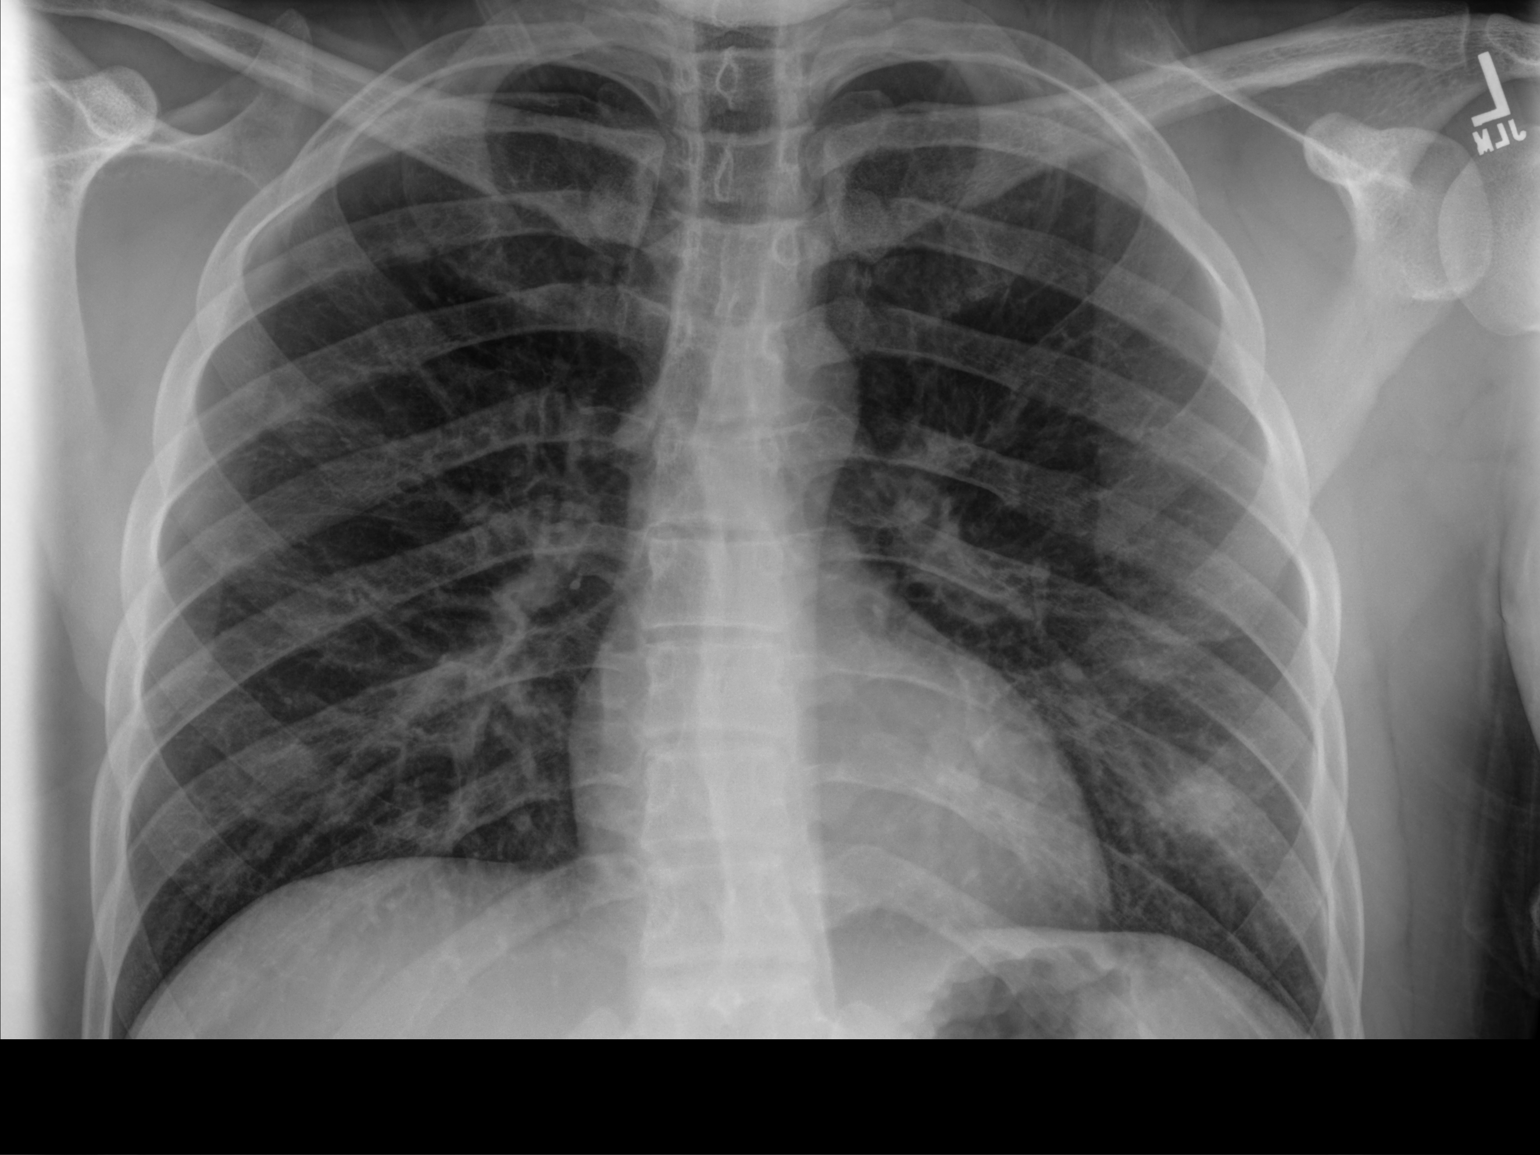

[chest lat]
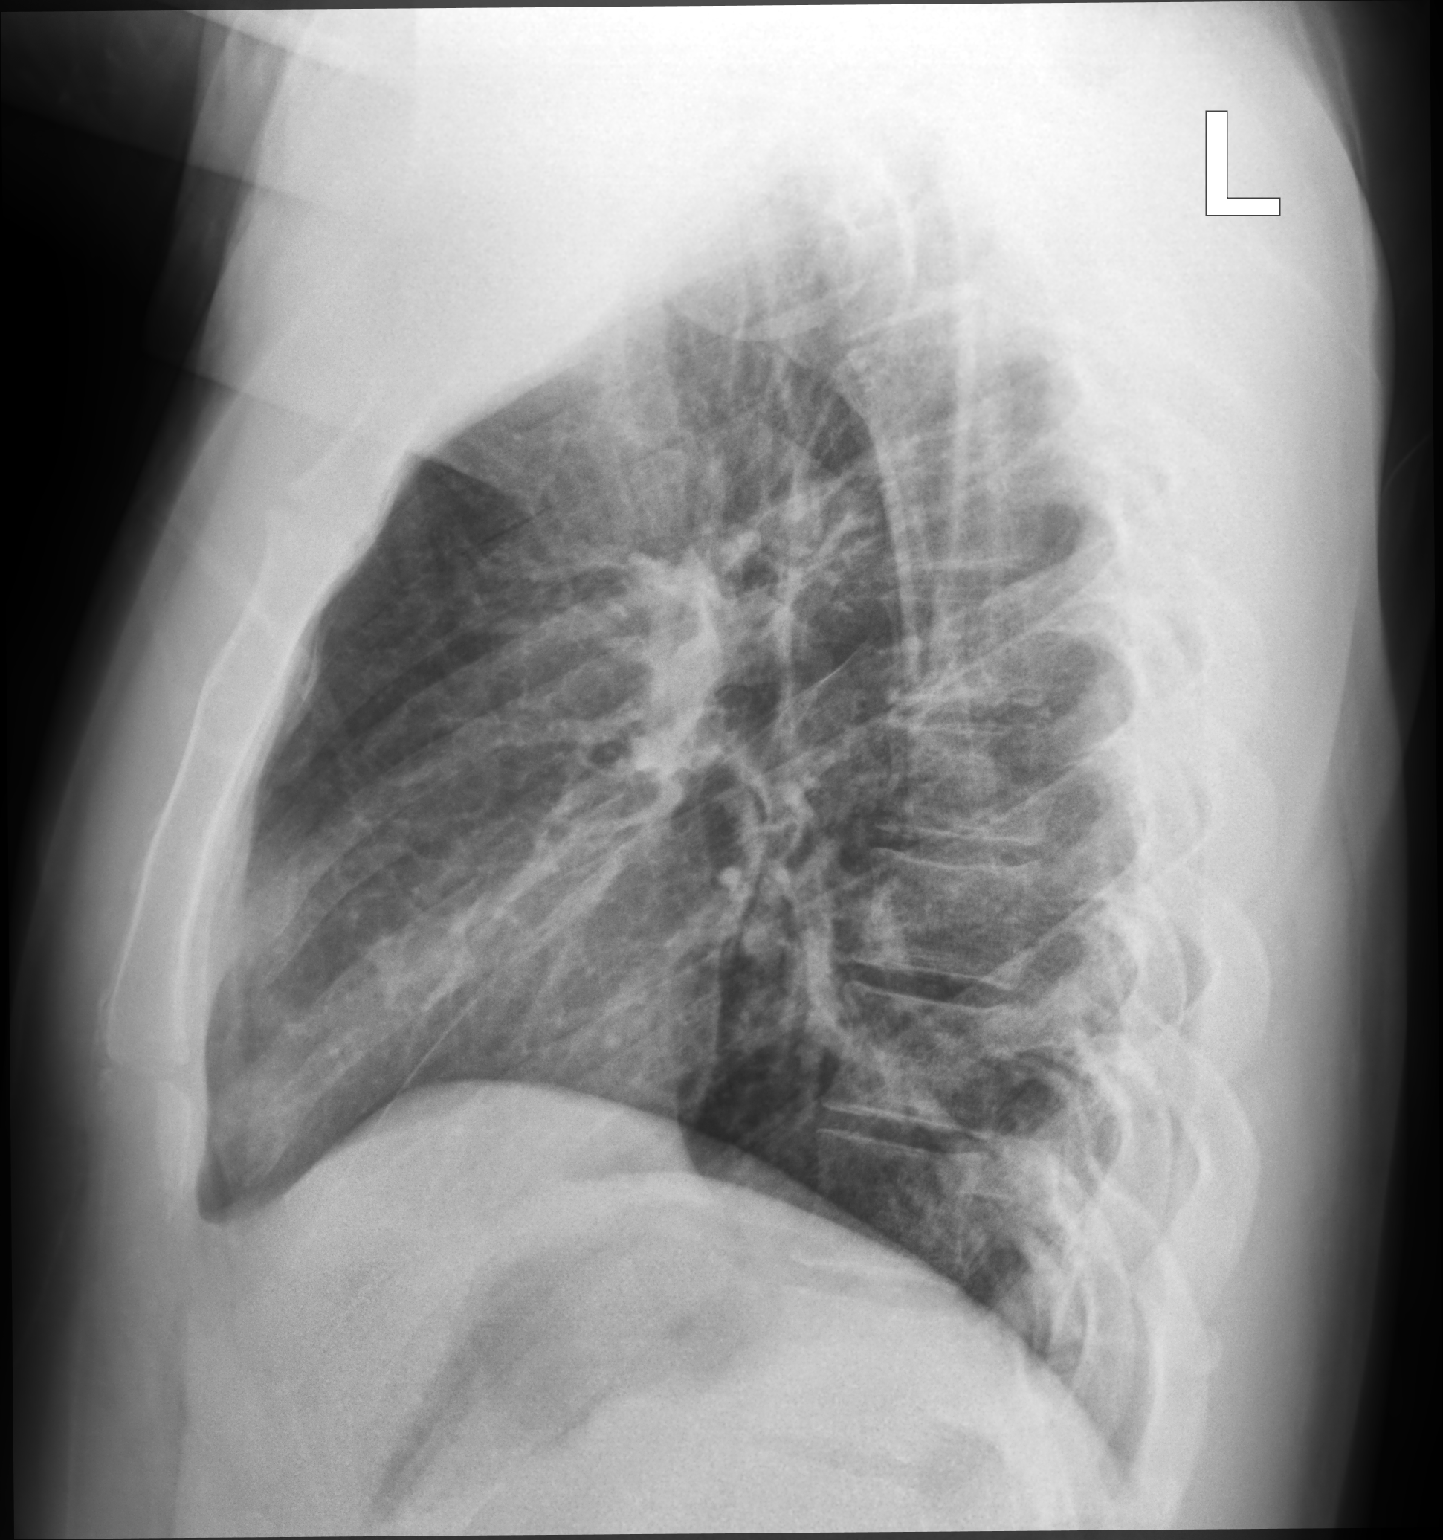

[2 of 2 positions shown; findings below may reference images not displayed]

FINDINGS: Normal lung volumes and mediastinal contours. Visualized tracheal
air column is within normal limits. No consolidation or pleural
effusion. On the AP view there is asymmetric density at the junction
of the anterior left 5th and posterior 8th ribs, with no definite
abnormal opacity on the lateral. Elsewhere lung markings appear
normal.

No osseous abnormality identified. Negative visible bowel gas
pattern.
IMPRESSION: Questionable asymmetric increased left lower lung opacity, only on
the AP view. Consider mild or developing left bronchopneumonia in
this setting.
Follow-up radiographs may be valuable.

## 2022-10-05 IMAGING — CR DG CHEST 2V
2 series · 2 of 2 positions shown · non-contrast
Comparison: Same day.

CLINICAL DATA: Chest pain.

EXAM:
CHEST - 2 VIEW

[chest pa]
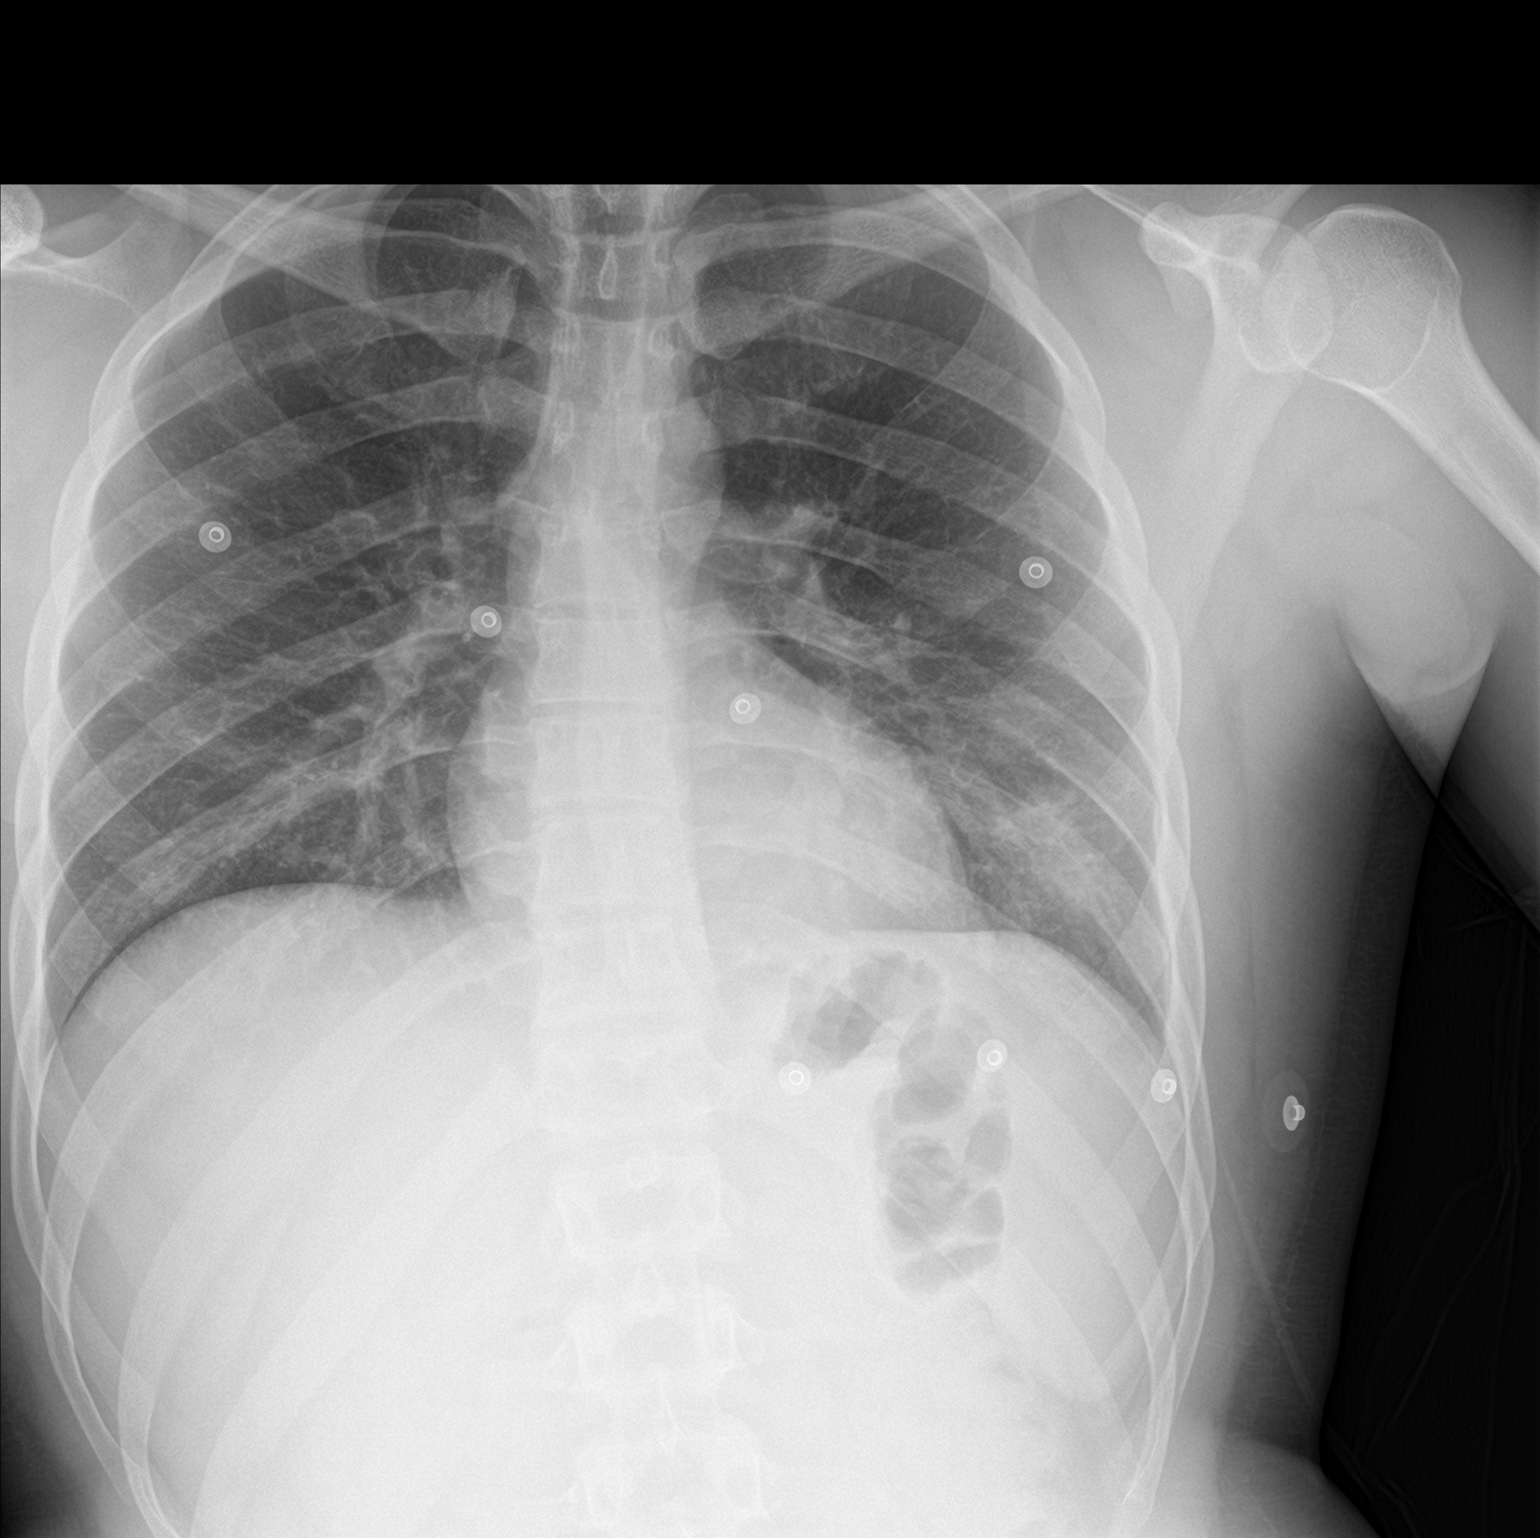

[chest lat]
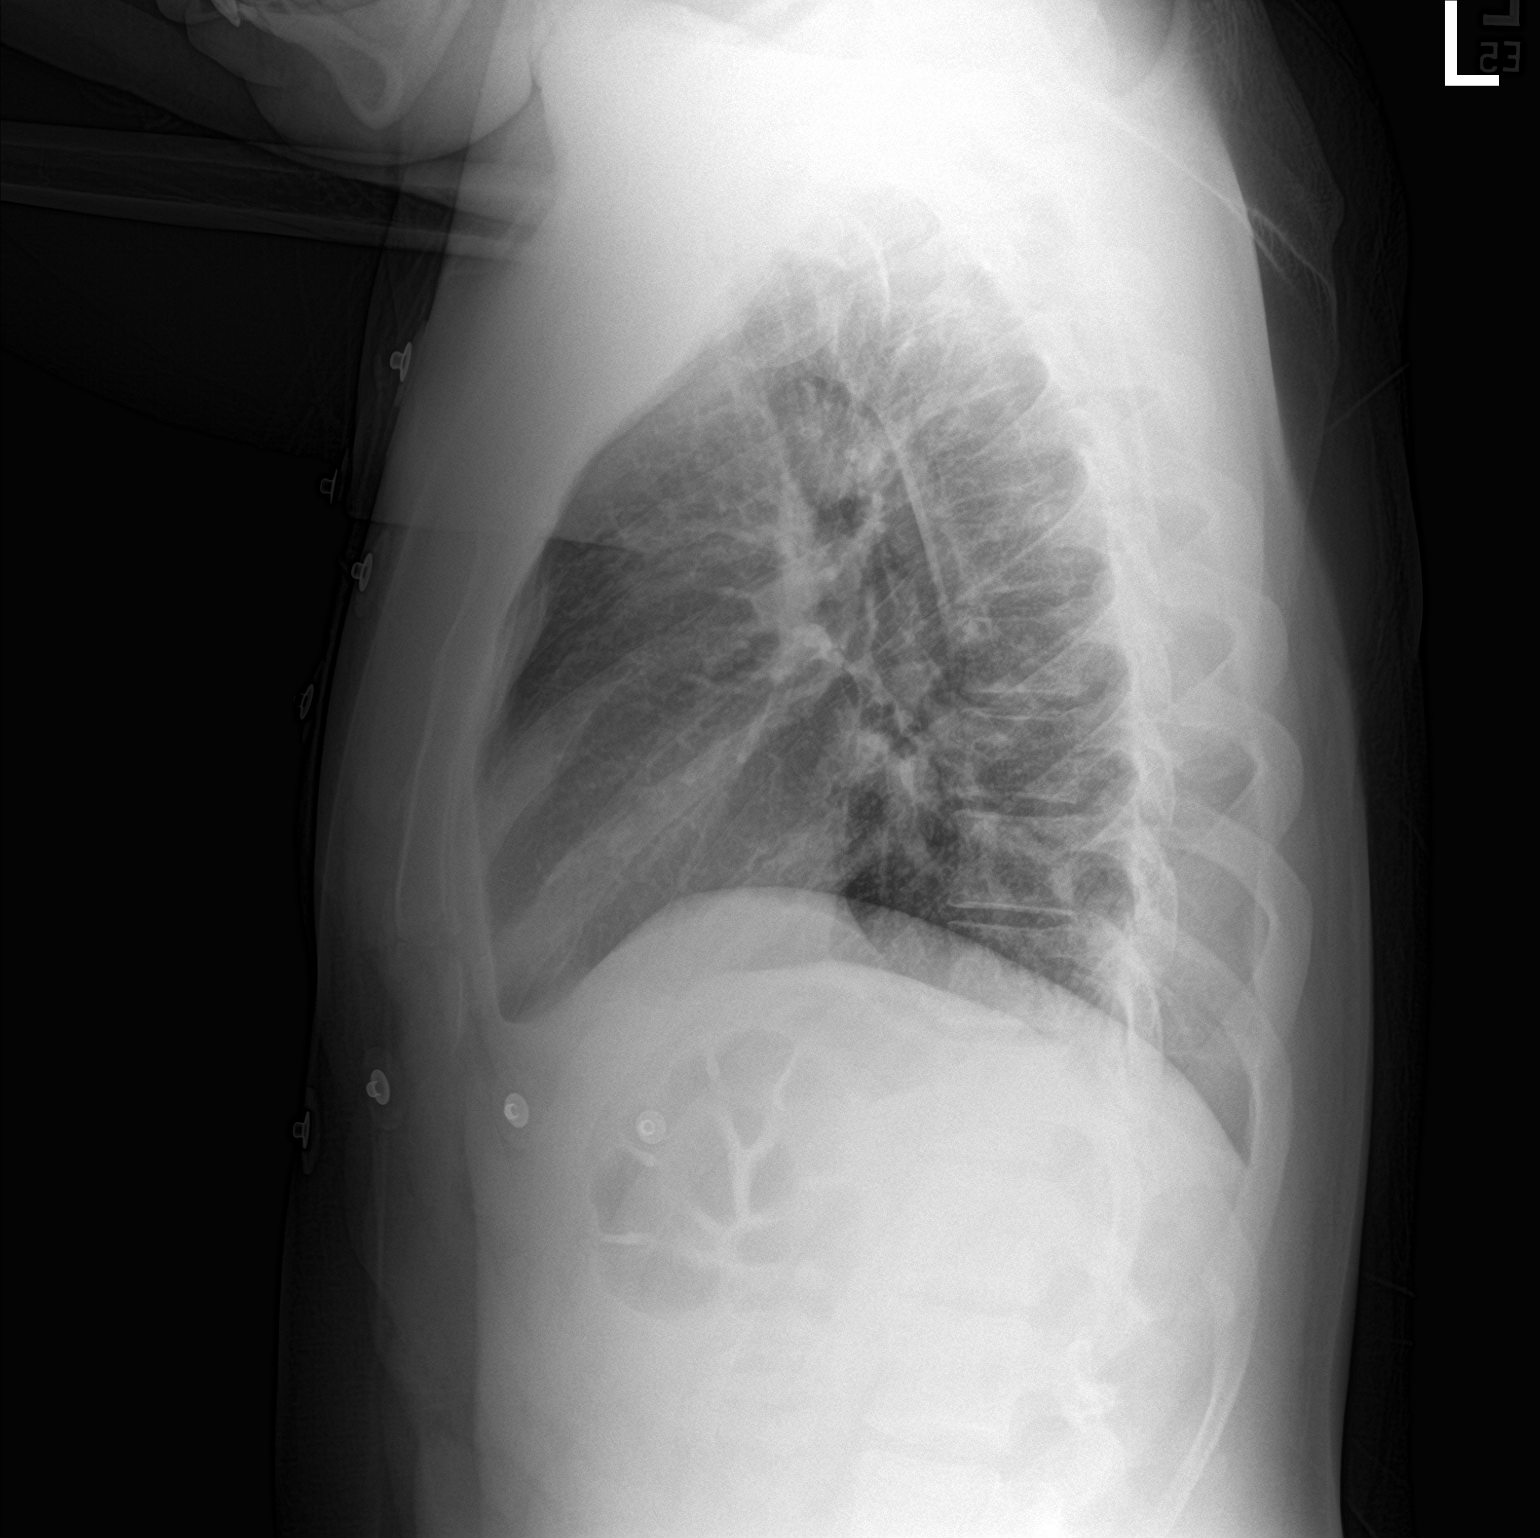

[2 of 2 positions shown; findings below may reference images not displayed]

FINDINGS: The heart size and mediastinal contours are within normal limits.
Right lung is clear. Continued presence of left lower lobe opacity
is noted concerning for possible pneumonia or atelectasis. The
visualized skeletal structures are unremarkable.
IMPRESSION: Small left lower lobe airspace opacity is noted concerning for
possible pneumonia or atelectasis.

## 2022-10-06 IMAGING — CT CT ANGIO CHEST
2 of 6 series · 17 of 46 positions shown · IV contrast (APPLIED)
Comparison: None.

CLINICAL DATA: Shortness of breath, chest pain

EXAM:
CT ANGIOGRAPHY CHEST WITH CONTRAST
TECHNIQUE: Multidetector CT imaging of the chest was performed using the
standard protocol during bolus administration of intravenous
contrast. Multiplanar CT image reconstructions and MIPs were
obtained to evaluate the vascular anatomy.
CONTRAST:  40mL OMNIPAQUE IOHEXOL 350 MG/ML SOLN

[Series 7: thins · axial · 0.83mm/px · z∈[+1330,+1586]mm · 14 of 401 slices shown]
[im 18/401  lung]
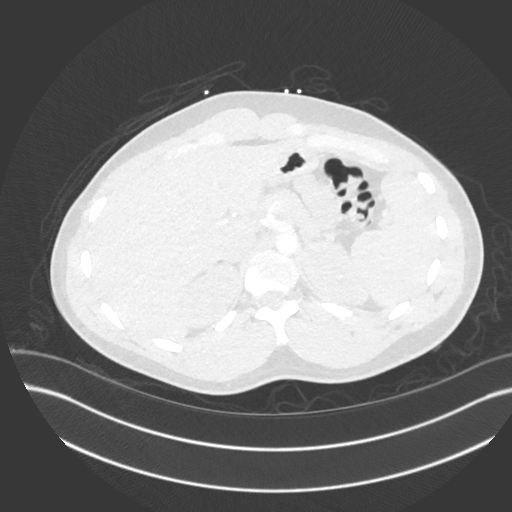
[im 53/401  soft-tissue]
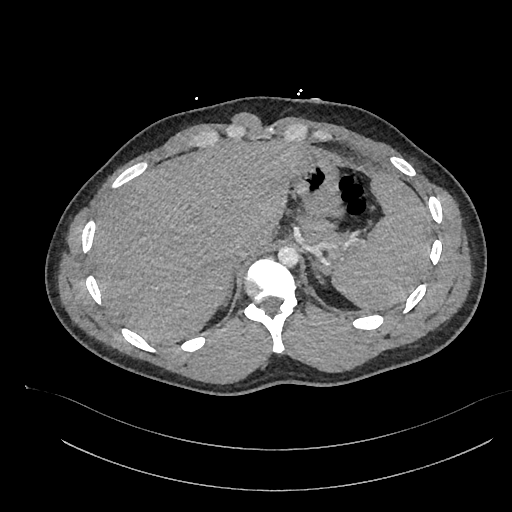
[im 70/401  lung]
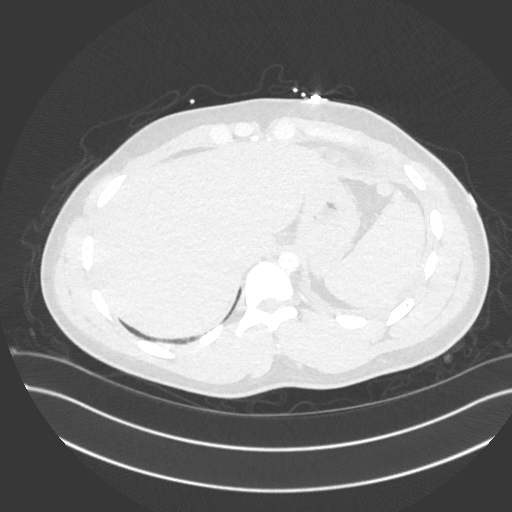
[im 105/401  soft-tissue]
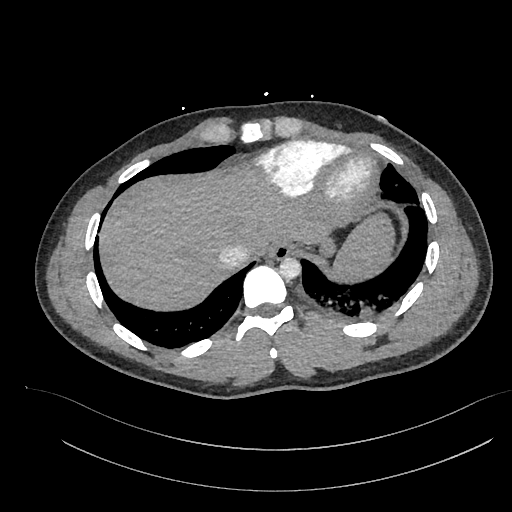
[im 140/401  lung]
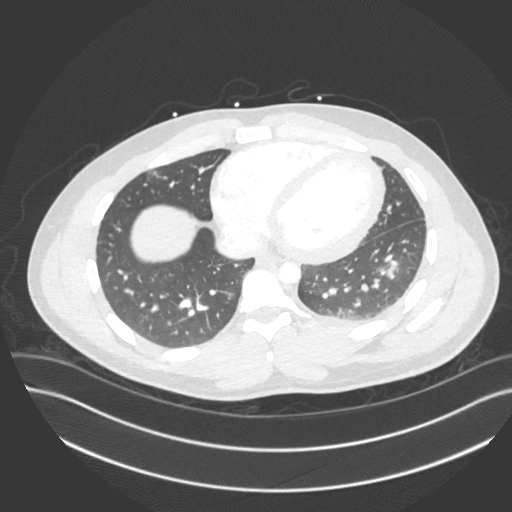
[im 157/401  soft-tissue]
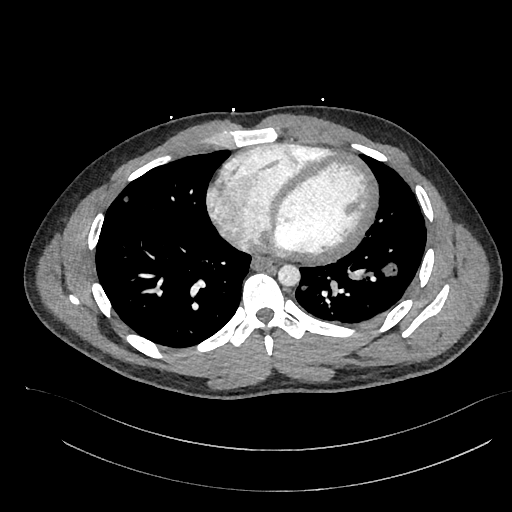
[im 192/401  lung]
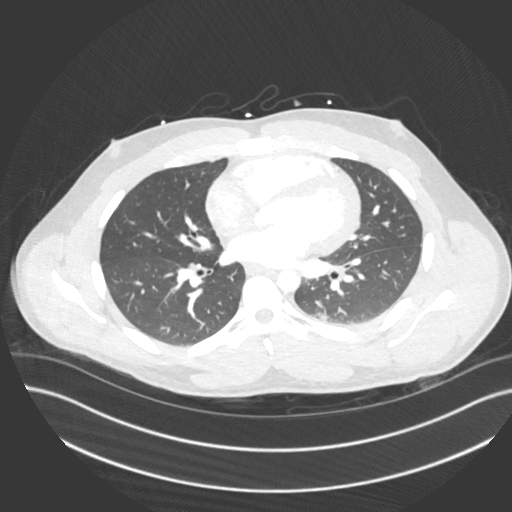
[im 209/401  soft-tissue]
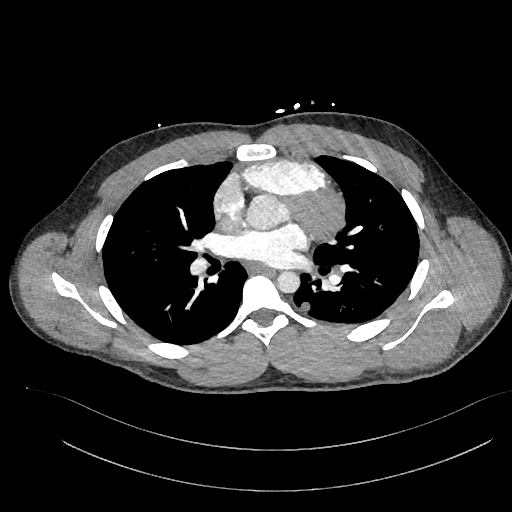
[im 244/401  lung]
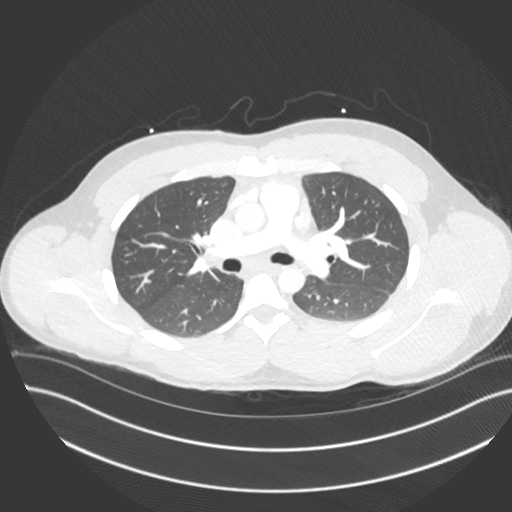
[im 261/401  soft-tissue]
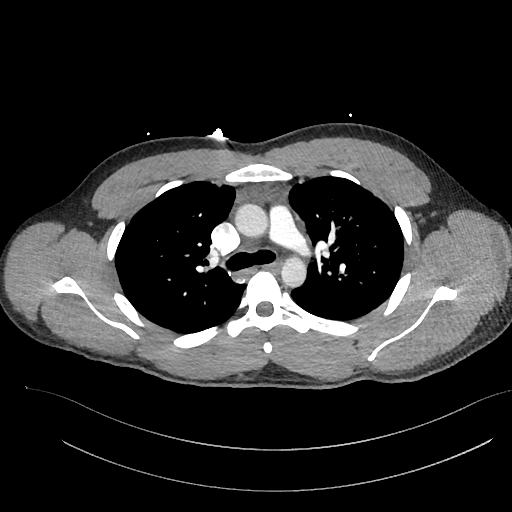
[im 296/401  lung]
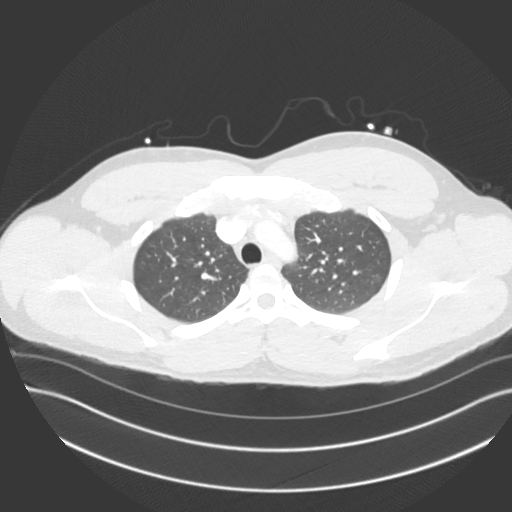
[im 331/401  soft-tissue]
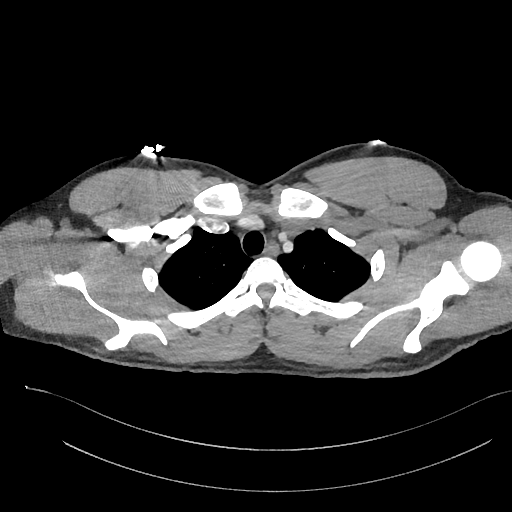
[im 348/401  lung]
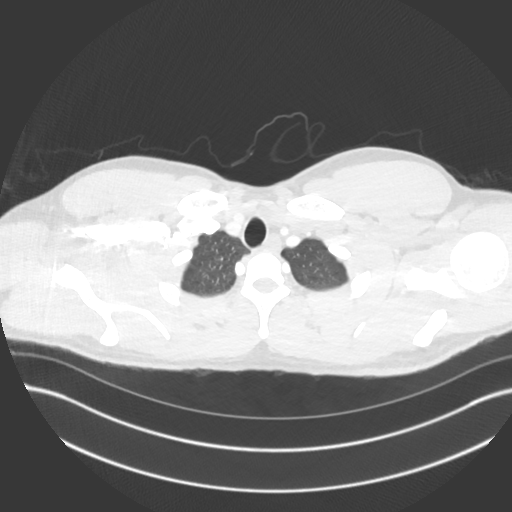
[im 383/401  soft-tissue]
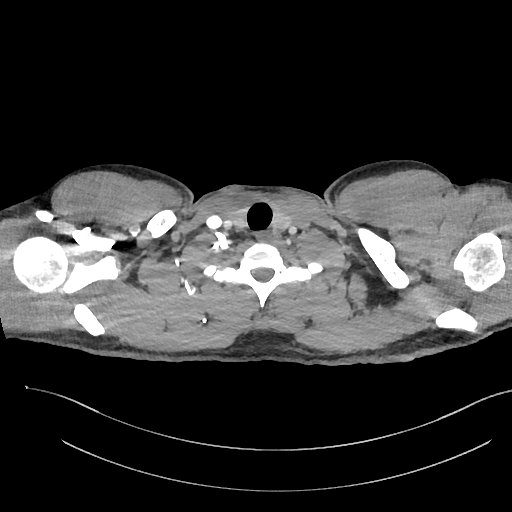

[Series 8: cor · coronal · 0.53mm/px · 3 of 129 slices shown]
[im 33/129  soft-tissue]
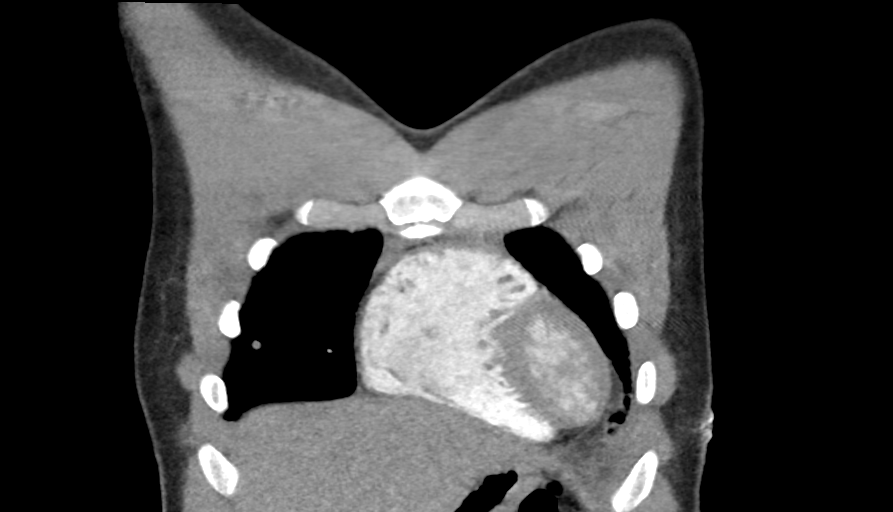
[im 65/129  soft-tissue]
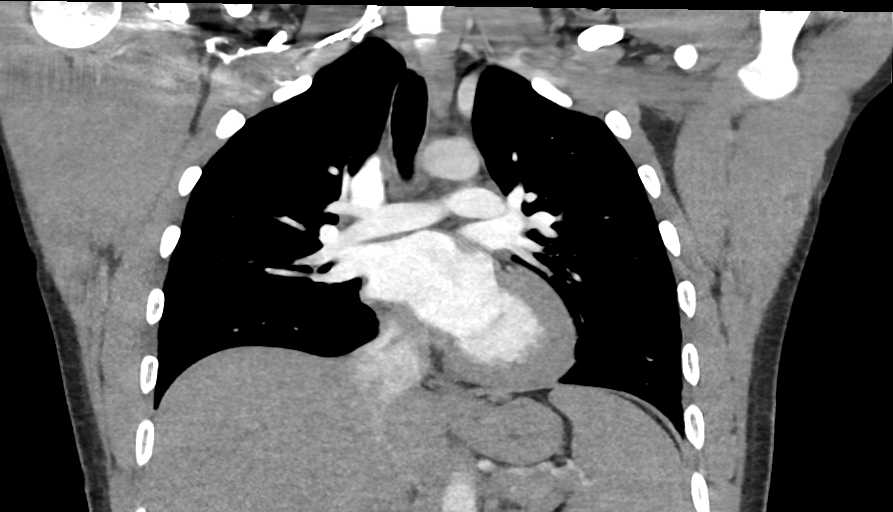
[im 97/129  soft-tissue]
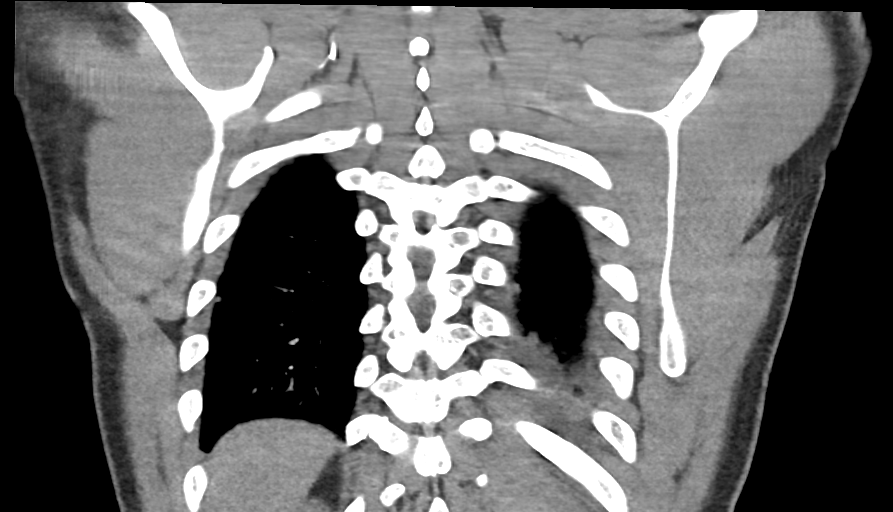

[17 of 46 positions shown; findings below may reference images not displayed]

FINDINGS: Cardiovascular: No filling defects in the pulmonary arteries to
suggest pulmonary emboli. Heart is normal size. Aorta is normal
caliber.

Mediastinum/Nodes: No mediastinal, hilar, or axillary adenopathy.
Soft tissue in anterior mediastinum thought to reflect residual
thymus. Trachea and esophagus are unremarkable. Thyroid
unremarkable.

Lungs/Pleura: Consolidation in the lingula and left lower lobe
concerning for pneumonia. Scattered pulmonary nodules, the largest
in the left lower lobe with central cavitation measures 2.4 cm.
Lingular nodule measures 9 mm. Right upper lobe nodule measures 7
mm. Several other similarly sized nodules throughout both lungs. No
effusions.

Upper Abdomen: Imaging into the upper abdomen demonstrates no acute
findings.

Musculoskeletal: Chest wall soft tissues are unremarkable. No acute
bony abnormality.

Review of the MIP images confirms the above findings.
IMPRESSION: No evidence of pulmonary embolus.

Numerous bilateral pulmonary nodules, the largest 2.4 cm with
central cavitation in the left lower lobe. Differential
considerations would include atypical infection or neoplasm. Septic
emboli is a possibility.

Consolidation in the left lower lobe and inferior lingula concerning
for pneumonia.
